# Patient Record
Sex: Female | Born: 1980 | Race: White | Hispanic: No | Marital: Married
Health system: Southern US, Community
[De-identification: ages and names within clinical notes are randomized; demographics above are authoritative.]

## PROBLEM LIST (undated history)

## (undated) DIAGNOSIS — E559 Vitamin D deficiency, unspecified: Secondary | ICD-10-CM

## (undated) DIAGNOSIS — I700 Atherosclerosis of aorta: Secondary | ICD-10-CM

## (undated) DIAGNOSIS — F419 Anxiety disorder, unspecified: Secondary | ICD-10-CM

## (undated) DIAGNOSIS — R079 Chest pain, unspecified: Secondary | ICD-10-CM

## (undated) DIAGNOSIS — K219 Gastro-esophageal reflux disease without esophagitis: Secondary | ICD-10-CM

## (undated) DIAGNOSIS — I10 Essential (primary) hypertension: Secondary | ICD-10-CM

## (undated) HISTORY — DX: Vitamin D deficiency, unspecified: E55.9

## (undated) HISTORY — DX: Gastro-esophageal reflux disease without esophagitis: K21.9

## (undated) HISTORY — DX: Atherosclerosis of aorta: I70.0

## (undated) HISTORY — DX: Anxiety disorder, unspecified: F41.9

## (undated) HISTORY — DX: Chest pain, unspecified: R07.9

## (undated) HISTORY — PX: NO PAST SURGERIES: SHX2092

## (undated) HISTORY — PX: ESOPHAGOGASTRODUODENOSCOPY: SHX1529

---

## 2003-10-29 ENCOUNTER — Emergency Department (HOSPITAL_COMMUNITY): Admission: EM | Admit: 2003-10-29 | Discharge: 2003-10-30 | Payer: Self-pay | Admitting: Emergency Medicine

## 2005-10-13 ENCOUNTER — Observation Stay: Payer: Self-pay

## 2005-10-20 ENCOUNTER — Inpatient Hospital Stay: Payer: Self-pay | Admitting: Obstetrics and Gynecology

## 2005-11-10 ENCOUNTER — Observation Stay: Payer: Self-pay | Admitting: Obstetrics and Gynecology

## 2005-11-12 ENCOUNTER — Inpatient Hospital Stay: Payer: Self-pay

## 2006-08-17 ENCOUNTER — Inpatient Hospital Stay: Payer: Self-pay | Admitting: Internal Medicine

## 2009-11-09 ENCOUNTER — Ambulatory Visit: Payer: Self-pay | Admitting: Obstetrics and Gynecology

## 2009-11-09 ENCOUNTER — Encounter: Payer: Self-pay | Admitting: Obstetrics & Gynecology

## 2009-11-09 LAB — CONVERTED CEMR LAB: Hepatitis B Surface Ag: NEGATIVE

## 2010-06-15 NOTE — Assessment & Plan Note (Signed)
NAME:  Karen Ortega, Karen Ortega NO.:  [id number]   MEDICAL RECORD NO.:  [id number]          PATIENT TYPE:  POB   LOCATION:  [facility] at [facility]         FACILITY:  [facility] [place]   PHYSICIAN:  Catalina Antigua, MD     DATE OF BIRTH:  06/29/1980   DATE OF SERVICE:  11/09/2009                                  CLINIC NOTE   This is 30 year old G1, P1 who presents today for annual exam.  The  patient is currently without any significant complaints.  Denies  abnormal discharge or pelvic pain.  The patient was previously using  Depo-Provera for birth control and stopped in May 2011.  The patient  states that since stopping the Depo, she experienced no bleeding for  approximately a month and then started daily spotting since.  The  patient denies any chest pain, lightheadedness, or dizziness.  No  palpitations.  The patient is currently not using any form of birth  control and is not trying to seek pregnancy.  The patient is also  interested in STD testing today.   PAST MEDICAL HISTORY:  She denies.   PAST SURGICAL HISTORY:  Also denies.   PAST OBSTETRIC HISTORY:  She has had 1 full-term vaginal delivery.   PAST GYNECOLOGIC HISTORY:  She denies any cyst or fibroids or previous  STDs.  She does report a history of abnormal Pap smear status post  colposcopy and questionable LEEP procedure and reports that her Pap  smear have been normal since.   SOCIAL HISTORY:  She is a current smoker of one pack a day for  approximately 14 years.  The patient is not currently interested in  quitting smoking at this time.  She denies EtOH abuse or of the use of  illicit drugs.   FAMILY HISTORY:  Significant for heart disease and hypertension in her  mother.   REVIEW OF SYSTEM:  Significant for anxiety symptoms, but otherwise  within normal limits.   PHYSICAL EXAMINATION:  VITAL SIGNS:  Her blood pressure is 139/93, pulse  of 82, weight 104 pounds, height of 5 feet 8 inches.  LUNGS:  Clear to  auscultation bilaterally.  HEART:  Regular rate and rhythm.  BREASTS:  Nontender, equal in size.  No palpable masses or  lymphadenopathy.  No expressible nipple discharge or skin dimpling.  ABDOMEN:  Soft, nontender, nondistended.  PELVIC:  She had normal vaginal mucosa and appearing cervix.  No  abnormal bleeding or discharge.  She has a small anteverted uterus.  No  palpable masses or tenderness.   ASSESSMENT AND PLAN:  This is 30 year old G1, P1 who is here for annual  exam, Pap smear, and sexually transmitted disease testing including  hepatitis B, hepatitis C, syphilis, and HIV testing were done.  It was  explained to the patient that she may experience irregular bleeding  after stopping Depo-Provera for an unpredictable amount of time, which  does not mean that her fertility is not restored.  Therefore, the  patient was interested in initiating birth control pill and a  prescription for Cyclessa was provided.  The patient was also encouraged  to quit smoking or think about quitting smoking.  The  patient will be  contacted with any abnormal results.  The patient is to return in 1 year  or p.r.n.  The patient was also advised to follow up with a primary care  physician in view of her borderlinely elevated blood pressure.  The  patient states that she is normally normotensive and has had a recent  physical with her primary care physician and everything was normal.  Records from her previous physicians have been requested.           ______________________________  Catalina Antigua, MD     PC/MEDQ  D:  11/09/2009  T:  11/10/2009  Job:  [id number]

## 2013-07-16 ENCOUNTER — Emergency Department (HOSPITAL_COMMUNITY)
Admission: EM | Admit: 2013-07-16 | Discharge: 2013-07-16 | Disposition: A | Discharge status: Home / Self Care | Payer: Self-pay | Attending: Emergency Medicine | Admitting: Emergency Medicine

## 2013-07-16 ENCOUNTER — Encounter (HOSPITAL_COMMUNITY): Payer: Self-pay | Admitting: Emergency Medicine

## 2013-07-16 DIAGNOSIS — T63461A Toxic effect of venom of wasps, accidental (unintentional), initial encounter: Secondary | ICD-10-CM | POA: Insufficient documentation

## 2013-07-16 DIAGNOSIS — Y929 Unspecified place or not applicable: Secondary | ICD-10-CM | POA: Insufficient documentation

## 2013-07-16 DIAGNOSIS — T63481A Toxic effect of venom of other arthropod, accidental (unintentional), initial encounter: Secondary | ICD-10-CM

## 2013-07-16 DIAGNOSIS — Y990 Civilian activity done for income or pay: Secondary | ICD-10-CM | POA: Insufficient documentation

## 2013-07-16 DIAGNOSIS — F17200 Nicotine dependence, unspecified, uncomplicated: Secondary | ICD-10-CM | POA: Insufficient documentation

## 2013-07-16 DIAGNOSIS — Y9389 Activity, other specified: Secondary | ICD-10-CM | POA: Insufficient documentation

## 2013-07-16 DIAGNOSIS — T6391XA Toxic effect of contact with unspecified venomous animal, accidental (unintentional), initial encounter: Secondary | ICD-10-CM | POA: Insufficient documentation

## 2013-07-16 NOTE — ED Provider Notes (Signed)
CSN: [id number]     Arrival date & time 07/16/13  0840 History  This chart was scribed for non-physician practitioner, Ivar Drapeob Browning, working with Joya Gaskinsonald W Wickline, MD by Milly JakobJohn Lee Graves, ED Scribe. The patient was seen in room TR08C/TR08C. Patient's care was started at 9:41 AM.       Chief Complaint  Patient presents with  . Hand Pain    The history is provided by the patient. No language interpreter was used.   HPI Comments: Bennett ScrapeHolli Ortega is a 33 y.o. female who presents to the Emergency Department with chief complaint of insect bite. She states that she put on some gloves while at work today, and was stung by something. She noticed some swelling to the right hand, and also became diaphoretic. 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 She has tried taking one Benadryl with good relief. She denies any radiating symptoms. There are no aggravating factors.  History reviewed. No pertinent past medical history. History reviewed. No pertinent past surgical history. No family history on file. History  Substance Use Topics  . Smoking status: Current Every Day Smoker  . Smokeless tobacco: Not on file  . Alcohol Use: Yes   OB History   Grav Para Term Preterm Abortions TAB SAB Ect Mult Living                 Review of Systems  Constitutional: Negative for fever and chills.  Respiratory: Negative for shortness of breath.   Cardiovascular: Negative for chest pain.  Gastrointestinal: Negative for nausea, vomiting, diarrhea and constipation.  Genitourinary: Negative for dysuria.  Skin: Positive for rash.      Allergies  Review of patient's allergies indicates no known allergies.  Home Medications   Prior to Admission medications   Not on File   Treatment Vitals: BP 137/94  Pulse 91  Temp(Src) 97.6 F (36.4 C) (Oral)  Resp 22  Ht 5\' 6"  (1.676 m)  Wt 100 lb (45.36 kg)  BMI 16.15 kg/m2  SpO2  100% Physical Exam  Nursing note and vitals reviewed. Constitutional: She is oriented to person, place, and time. She appears well-developed and well-nourished. No distress.  HENT:  Head: Normocephalic and atraumatic.  Eyes: Conjunctivae and EOM are normal. Pupils are equal, round, and reactive to light.  Neck: Normal range of motion. Neck supple. No tracheal deviation present.  Cardiovascular: Normal rate and regular rhythm.  Exam reveals no gallop and no friction rub.   No murmur heard. Pulmonary/Chest: Effort normal and breath sounds normal. No respiratory distress. She has no wheezes. She has no rales. She exhibits no tenderness.  Clear to auscultation bilaterally, no wheezes  Abdominal: Soft. Bowel sounds are normal. She exhibits no distension and no mass. There is no tenderness. There is no rebound and no guarding.  Musculoskeletal: Normal range of motion. She exhibits no edema and no tenderness.  Neurological: She is alert and oriented to person, place, and time.  Skin: Skin is warm and dry.  Mild swelling around the third MCP of the right hand  Psychiatric: She has a normal mood and affect. Her behavior is normal. Judgment and thought content normal.    ED Course  Procedures (including critical care time) DIAGNOSTIC STUDIES: Oxygen Saturation is 100% on room air, normal by my interpretation.    Labs Review Labs Reviewed - No data to display  Imaging Review No results found.  EKG Interpretation None      MDM   Final diagnoses:  Insect sting    The patient with insect bite to the right hand, possibly a bee sting, there is moderate swelling about the right third MCP, patient is not in any respiratory distress. She speaks in full sentences. Her lungs are clear. No evidence of anaphylactic reaction. Patient given Benadryl, and states that her symptoms are improving. Will monitor for approximately a half hour, and will reassess. Anticipate discharge to home.  10:33  AM Reexamined, with no respiratory distress, wheezing, or throat swelling. Monitored for approximately 2 hours the emergency department. Improvement in symptoms. Will discharge to home. Recommend continued Benadryl.  I personally performed the services described in this documentation, which was scribed in my presence. The recorded information has been reviewed and is accurate.     Roxy Horsemanobert Browning, PA-C 07/16/13 1034

## 2013-07-16 NOTE — Discharge Instructions (Signed)
Bee, Wasp, or Hornet Sting °Your caregiver has diagnosed you as having an insect sting. An insect sting appears as a red lump in the skin that sometimes has a tiny hole in the center, or it may have a stinger in the center of the wound. The most common stings are from wasps, hornets and bees. °Individuals have different reactions to insect stings. °· A normal reaction may cause pain, swelling, and redness around the sting site. °· A localized allergic reaction may cause swelling and redness that extends beyond the sting site. °· A large local reaction may continue to develop over the next 12 to 36 hours. °· On occasion, the reactions can be severe (anaphylactic reaction). An anaphylactic reaction may cause wheezing; difficulty breathing; chest pain; fainting; raised, itchy, red patches on the skin; a sick feeling to your stomach (nausea); vomiting; cramping; or diarrhea. If you have had an anaphylactic reaction to an insect sting in the past, you are more likely to have one again. °HOME CARE INSTRUCTIONS  °· With bee stings, a small sac of poison is left in the wound. Brushing across this with something such as a credit card, or anything similar, will help remove this and decrease the amount of the reaction. This same procedure will not help a wasp sting as they do not leave behind a stinger and poison sac. °· Apply a cold compress for 10 to 20 minutes every hour for 1 to 2 days, depending on severity, to reduce swelling and itching. °· To lessen pain, a paste made of water and baking soda may be rubbed on the bite or sting and left on for 5 minutes. °· To relieve itching and swelling, you may use take medication or apply medicated creams or lotions as directed. °· Only take over-the-counter or prescription medicines for pain, discomfort, or fever as directed by your caregiver. °· Wash the sting site daily with soap and water. Apply antibiotic ointment on the sting site as directed. °· If you suffered a severe  reaction: °· If you did not require hospitalization, an adult will need to stay with you for 24 hours in case the symptoms return. °· You may need to wear a medical bracelet or necklace stating the allergy. °· You and your family need to learn when and how to use an anaphylaxis kit or epinephrine injection. °· If you have had a severe reaction before, always carry your anaphylaxis kit with you. °SEEK MEDICAL CARE IF:  °· None of the above helps within 2 to 3 days. °· The area becomes red, warm, tender, and swollen beyond the area of the bite or sting. °· You have an oral temperature above 102° F (38.9° C). °SEEK IMMEDIATE MEDICAL CARE IF:  °You have symptoms of an allergic reaction which are: °· Wheezing. °· Difficulty breathing. °· Chest pain. °· Lightheadedness or fainting. °· Itchy, raised, red patches on the skin. °· Nausea, vomiting, cramping or diarrhea. °ANY OF THESE SYMPTOMS MAY REPRESENT A SERIOUS PROBLEM THAT IS AN EMERGENCY. Do not wait to see if the symptoms will go away. Get medical help right away. Call your local emergency services (911 in [place] DO NOT drive yourself to the hospital. °MAKE SURE YOU:  °· Understand these instructions. °· Will watch your condition. °· Will get help right away if you are not doing well or get worse. °Document Released: 01/17/2005 Document Revised: 04/11/2011 Document Reviewed: 07/04/2009 °Exit[employer]® Patient Information ©2014 [employer]. ° °

## 2013-07-16 NOTE — ED Notes (Signed)
Pt presents to department for evaluation of R hand swelling/pain, also states pain to neck. Possible insect bite at work this morning. 5/10 pain at the time. Pt is alert and oriented x4. NAD. Respirations unlabored.

## 2013-07-17 NOTE — ED Provider Notes (Signed)
Medical screening examination/treatment/procedure(s) were performed by non-physician practitioner and as supervising physician I was immediately available for consultation/collaboration.   EKG Interpretation None        Donald W Wickline, MD 07/17/13 2024 

## 2014-11-08 ENCOUNTER — Encounter: Payer: Self-pay | Admitting: Emergency Medicine

## 2014-11-08 ENCOUNTER — Emergency Department
Admission: EM | Admit: 2014-11-08 | Discharge: 2014-11-08 | Disposition: A | Discharge status: Home / Self Care | Payer: Self-pay | Attending: Emergency Medicine | Admitting: Emergency Medicine

## 2014-11-08 ENCOUNTER — Emergency Department: Payer: Self-pay

## 2014-11-08 DIAGNOSIS — Z3491 Encounter for supervision of normal pregnancy, unspecified, first trimester: Secondary | ICD-10-CM

## 2014-11-08 DIAGNOSIS — O99331 Smoking (tobacco) complicating pregnancy, first trimester: Secondary | ICD-10-CM | POA: Insufficient documentation

## 2014-11-08 DIAGNOSIS — Z3A01 Less than 8 weeks gestation of pregnancy: Secondary | ICD-10-CM | POA: Insufficient documentation

## 2014-11-08 DIAGNOSIS — O200 Threatened abortion: Secondary | ICD-10-CM | POA: Insufficient documentation

## 2014-11-08 DIAGNOSIS — F17210 Nicotine dependence, cigarettes, uncomplicated: Secondary | ICD-10-CM | POA: Insufficient documentation

## 2014-11-08 LAB — URINALYSIS COMPLETE WITH MICROSCOPIC (ARMC ONLY)
BILIRUBIN URINE: NEGATIVE
Bacteria, UA: NONE SEEN
Glucose, UA: NEGATIVE mg/dL
Ketones, ur: NEGATIVE mg/dL
Nitrite: NEGATIVE
PH: 6 (ref 5.0–8.0)
Protein, ur: 30 mg/dL — AB
Specific Gravity, Urine: 1.016 (ref 1.005–1.030)

## 2014-11-08 LAB — CBC WITH DIFFERENTIAL/PLATELET
BASOS ABS: 0 10*3/uL (ref 0–0.1)
BASOS PCT: 1 %
EOS ABS: 0 10*3/uL (ref 0–0.7)
Eosinophils Relative: 1 %
HEMATOCRIT: 45.2 % (ref 35.0–47.0)
HEMOGLOBIN: 15.3 g/dL (ref 12.0–16.0)
Lymphocytes Relative: 21 %
Lymphs Abs: 1.5 10*3/uL (ref 1.0–3.6)
MCH: 35.4 pg — ABNORMAL HIGH (ref 26.0–34.0)
MCHC: 33.8 g/dL (ref 32.0–36.0)
MCV: 105 fL — ABNORMAL HIGH (ref 80.0–100.0)
MONOS PCT: 8 %
Monocytes Absolute: 0.6 10*3/uL (ref 0.2–0.9)
NEUTROS ABS: 5 10*3/uL (ref 1.4–6.5)
NEUTROS PCT: 69 %
Platelets: 438 10*3/uL (ref 150–440)
RBC: 4.31 MIL/uL (ref 3.80–5.20)
RDW: 12.4 % (ref 11.5–14.5)
WBC: 7.1 10*3/uL (ref 3.6–11.0)

## 2014-11-08 LAB — BASIC METABOLIC PANEL
ANION GAP: 10 (ref 5–15)
BUN: 6 mg/dL (ref 6–20)
CALCIUM: 9 mg/dL (ref 8.9–10.3)
CO2: 25 mmol/L (ref 22–32)
CREATININE: 0.8 mg/dL (ref 0.44–1.00)
Chloride: 102 mmol/L (ref 101–111)
Glucose, Bld: 213 mg/dL — ABNORMAL HIGH (ref 65–99)
Potassium: 4 mmol/L (ref 3.5–5.1)
SODIUM: 137 mmol/L (ref 135–145)

## 2014-11-08 LAB — CHLAMYDIA/NGC RT PCR (ARMC ONLY)
Chlamydia Tr: NOT DETECTED
N GONORRHOEAE: NOT DETECTED

## 2014-11-08 LAB — WET PREP, GENITAL
Trich, Wet Prep: NONE SEEN
YEAST WET PREP: NONE SEEN

## 2014-11-08 LAB — POCT PREGNANCY, URINE: Preg Test, Ur: POSITIVE — AB

## 2014-11-08 LAB — HCG, QUANTITATIVE, PREGNANCY: hCG, Beta Chain, Quant, S: 1081 m[IU]/mL — ABNORMAL HIGH (ref <5)

## 2014-11-08 LAB — ABO/RH: ABO/RH(D): O POS

## 2014-11-08 NOTE — ED Provider Notes (Addendum)
[facility] Emergency Department Provider Note   ____________________________________________  Time seen: 11:30 AM I have reviewed the triage vital signs and the triage nursing note.  HISTORY  Chief Complaint Vaginal Bleeding and Threatened Miscarriage   Historian Patient  HPI Karen Ortega is a 34 y.o. female who is G2 P1 who took a pregnancy test at home yesterday and it was positive. Patient has had about 1 week of mild vaginal spotting. Mild left lower quadrant discomfort. No dysuria. No hematuria. Last pregnancy was 9 years ago. States that her periods are irregular, so she does not know when her last menstrual period was.    History reviewed. No pertinent past medical history.  There are no active problems to display for this patient.   History reviewed. No pertinent past surgical history.  No current outpatient prescriptions on file. none  Allergies Bee venom and Other  History reviewed. No pertinent family history.  Social History Social History  Substance Use Topics  . Smoking status: Current Every Day Smoker -- 0.50 packs/day    Types: Cigarettes  . Smokeless tobacco: None  . Alcohol Use: Yes    Review of Systems  Constitutional: Negative for fever. Eyes: Negative for visual changes. ENT: Recent nasal congestion/URI Cardiovascular: Negative for chest pain. Respiratory: Negative for shortness of breath. Gastrointestinal: Negative for  vomiting and diarrhea. Genitourinary: Negative for dysuria. Musculoskeletal: Negative for back pain. Skin: Negative for rash. Neurological: Negative for headache. 10 point Review of Systems otherwise negative ____________________________________________   PHYSICAL EXAM:  VITAL SIGNS: ED Triage Vitals  Enc Vitals Group     BP 11/08/14 0828 137/109 mmHg     Pulse Rate 11/08/14 0828 123     Resp 11/08/14 0828 20     Temp 11/08/14 0828 98.3 F (36.8 C)     Temp Source 11/08/14 0828 Oral      SpO2 11/08/14 0828 99 %     Weight 11/08/14 0828 108 lb (48.988 kg)     Height 11/08/14 0828  (1.702 m)     Head Cir --      Peak Flow --      Pain Score 11/08/14 0828 2     Pain Loc --      Pain Edu? --      Excl. in GC? --      Constitutional: Alert and oriented. Well appearing and in no distress. Eyes: Conjunctivae are normal. PERRL. Normal extraocular movements. ENT   Head: Normocephalic and atraumatic.   Nose: No congestion/rhinnorhea.   Mouth/Throat: Mucous membranes are moist.   Neck: No stridor. Cardiovascular/Chest: Normal rate, regular rhythm.  No murmurs, rubs, or gallops. Respiratory: Normal respiratory effort without tachypnea nor retractions. Breath sounds are clear and equal bilaterally. No wheezes/rales/rhonchi. Gastrointestinal: Soft. No distention, no guarding, no rebound. Mild tenderness in the left lower quadrant.  Genitourinary/rectal: Small brown brown discharge on pelvic exam. No vaginal clots. No evidence of cervicitis. Mild left lower quadrant/adnexal tenderness without mass. Musculoskeletal: Nontender with normal range of motion in all extremities. No joint effusions.  No lower extremity tenderness.  No edema. Neurologic:  Normal speech and language. No gross or focal neurologic deficits are appreciated. Skin:  Skin is warm, dry and intact. No rash noted. Psychiatric: Mood and affect are normal. Speech and behavior are normal. Patient exhibits appropriate insight and judgment.  ____________________________________________   EKG I, Governor Rooks, MD, the attending physician have personally viewed and interpreted all ECGs.  No EKG performed ____________________________________________  LABS (pertinent positives/negatives)  Urine pregnancy test negative Beta hCG 1081 Wet prep clue cells and moderate white blood cells Gonorrhea and Chlamydia negative Blood type O+ Urinalysis 1+ leukocytes, 6-30 red blood cells, 6-30 white blood  cells, no bacteria, 6-30 squamous epithelial cells Basic metabolic panel without significant abnormality White blood count 7.1 with no left shift. Hemoglobin 15.3 and platelet count 438  ____________________________________________  RADIOLOGY All Xrays were viewed by me. Imaging interpreted by Radiologist.  Ultrasound transvaginal pelvic:   FINDINGS: Intrauterine gestational sac: Visualized/normal in shape.  Yolk sac: Not visualized.  Embryo: Not visualized.  Cardiac Activity: Not visualized.  MSD: 3.6 mm  5 w  1 d  [place] [facility]: July 10, 2015.  Maternal uterus/adnexae: No free fluid is noted. Right ovary appears normal. Probable corpus luteum cyst seen in left ovary.  IMPRESSION: Probable early intrauterine gestational sac, but no yolk sac, fetal pole, or cardiac activity yet visualized. Recommend follow-up quantitative B-HCG levels and follow-up US in 14 days to confirm and assess viability. This recommendation follows SRU consensus guidelines: Diagnostic Criteria for Nonviable Pregnancy Early in the First Trimester. Malva Limes Med 2013; 369:1443-51. __________________________________________  PROCEDURES  Procedure(s) performed: None  Critical Care performed: None  ____________________________________________   ED COURSE / ASSESSMENT AND PLAN  CONSULTATIONS: Phone consultation with OB/GYN Dr. Chauncey Cruel  Pertinent labs & imaging results that were available during my care of the patient were reviewed by me and considered in my medical decision making (see chart for details).  Small amount of vaginal spotting and positive pregnancy test at home, concerning for either early pregnancy, ectopic pregnancy, or threatened miscarriage.  Patient is asymptomatic from a bacterial vaginosis standpoint, despite white blood cells and clue cells on vaginal swab. I will hold off on treatment the first trimester as patient is asymptomatic.  Gestational sac consistent with 5 weeks.  Patient will need repeat beta hCG in 48 hours. I discussed this with Dr. Chauncey Cruel, and patient will call on Monday for a same-day appointment for this blood draw.  Patient is having no dysuria, or suprapubic discomfort, and her urinalysis looks like it is likely contaminated. I discussed with her that I will send a culture. Patient will be called if the culture does indeed grow a urinary tract infection.  Patient / Family / Caregiver informed of clinical course, medical decision-making process, and agree with plan.   I discussed return precautions, follow-up instructions, and discharged instructions with patient and/or family.  ___________________________________________   FINAL CLINICAL IMPRESSION(S) / ED DIAGNOSES   Final diagnoses:  First trimester pregnancy  Threatened miscarriage in early pregnancy       Governor Rooks, MD 11/08/14 1330  Governor Rooks, MD 11/08/14 1331

## 2014-11-08 NOTE — ED Notes (Signed)
Pt states approx 1 week ago she began spotting. Pt states she took an at home pregnancy test yesterday and it was positive. Pt states last night pt had increased spotting of bright red blood, denies clots at this time. Pt also c/o cramping in her lower abdomen.

## 2014-11-08 NOTE — Discharge Instructions (Signed)
You have had a positive pregnancy test, and you will need a repeat beta hCG pregnancy hormone test on Monday. Call the OB/GYN office on Monday morning and tell them you need a blood draw and appointment for the same day, on Monday.  Return to the emergency department for any worsening pain, vaginal bleeding heavier than 1 pad per hour for 3 hours, dizziness or passing out, or any other symptoms concerning to you.   First Trimester of Pregnancy The first trimester of pregnancy is from week 1 until the end of week 12 (months 1 through 3). A week after a sperm fertilizes an egg, the egg will implant on the wall of the uterus. This embryo will begin to develop into a baby. Genes from you and your partner are forming the baby. The female genes determine whether the baby is a boy or a girl. At 6-8 weeks, the eyes and face are formed, and the heartbeat can be seen on ultrasound. At the end of 12 weeks, all the baby's organs are formed.  Now that you are pregnant, you will want to do everything you can to have a healthy baby. Two of the most important things are to get good prenatal care and to follow your health care provider's instructions. Prenatal care is all the medical care you receive before the baby's birth. This care will help prevent, find, and treat any problems during the pregnancy and childbirth. BODY CHANGES Your body goes through many changes during pregnancy. The changes vary from woman to woman.   You may gain or lose a couple of pounds at first.  You may feel sick to your stomach (nauseous) and throw up (vomit). If the vomiting is uncontrollable, call your health care provider.  You may tire easily.  You may develop headaches that can be relieved by medicines approved by your health care provider.  You may urinate more often. Painful urination may mean you have a bladder infection.  You may develop heartburn as a result of your pregnancy.  You may develop constipation because certain  hormones are causing the muscles that push waste through your intestines to slow down.  You may develop hemorrhoids or swollen, bulging veins (varicose veins).  Your breasts may begin to grow larger and become tender. Your nipples may stick out more, and the tissue that surrounds them (areola) may become darker.  Your gums may bleed and may be sensitive to brushing and flossing.  Dark spots or blotches (chloasma, mask of pregnancy) may develop on your face. This will likely fade after the baby is born.  Your menstrual periods will stop.  You may have a loss of appetite.  You may develop cravings for certain kinds of food.  You may have changes in your emotions from day to day, such as being excited to be pregnant or being concerned that something may go wrong with the pregnancy and baby.  You may have more vivid and strange dreams.  You may have changes in your hair. These can include thickening of your hair, rapid growth, and changes in texture. Some women also have hair loss during or after pregnancy, or hair that feels dry or thin. Your hair will most likely return to normal after your baby is born. WHAT TO EXPECT AT YOUR PRENATAL VISITS During a routine prenatal visit:  You will be weighed to make sure you and the baby are growing normally.  Your blood pressure will be taken.  Your abdomen will be measured to  track your baby's growth.  The fetal heartbeat will be listened to starting around week 10 or 12 of your pregnancy.  Test results from any previous visits will be discussed. Your health care provider may ask you:  How you are feeling.  If you are feeling the baby move.  If you have had any abnormal symptoms, such as leaking fluid, bleeding, severe headaches, or abdominal cramping.  If you are using any tobacco products, including cigarettes, chewing tobacco, and electronic cigarettes.  If you have any questions. Other tests that may be performed during your first  trimester include:  Blood tests to find your blood type and to check for the presence of any previous infections. They will also be used to check for low iron levels (anemia) and Rh antibodies. Later in the pregnancy, blood tests for diabetes will be done along with other tests if problems develop.  Urine tests to check for infections, diabetes, or protein in the urine.  An ultrasound to confirm the proper growth and development of the baby.  An amniocentesis to check for possible genetic problems.  Fetal screens for spina bifida and Down syndrome.  You may need other tests to make sure you and the baby are doing well.  HIV (human immunodeficiency virus) testing. Routine prenatal testing includes screening for HIV, unless you choose not to have this test. HOME CARE INSTRUCTIONS  Medicines  Follow your health care provider's instructions regarding medicine use. Specific medicines may be either safe or unsafe to take during pregnancy.  Take your prenatal vitamins as directed.  If you develop constipation, try taking a stool softener if your health care provider approves. Diet  Eat regular, well-balanced meals. Choose a variety of foods, such as meat or vegetable-based protein, fish, milk and low-fat dairy products, vegetables, fruits, and whole grain breads and cereals. Your health care provider will help you determine the amount of weight gain that is right for you.  Avoid raw meat and uncooked cheese. These carry germs that can cause birth defects in the baby.  Eating four or five small meals rather than three large meals a day may help relieve nausea and vomiting. If you start to feel nauseous, eating a few soda crackers can be helpful. Drinking liquids between meals instead of during meals also seems to help nausea and vomiting.  If you develop constipation, eat more high-fiber foods, such as fresh vegetables or fruit and whole grains. Drink enough fluids to keep your urine clear or  pale yellow. Activity and Exercise  Exercise only as directed by your health care provider. Exercising will help you:  Control your weight.  Stay in shape.  Be prepared for labor and delivery.  Experiencing pain or cramping in the lower abdomen or low back is a good sign that you should stop exercising. Check with your health care provider before continuing normal exercises.  Try to avoid standing for long periods of time. Move your legs often if you must stand in one place for a long time.  Avoid heavy lifting.  Wear low-heeled shoes, and practice good posture.  You may continue to have sex unless your health care provider directs you otherwise. Relief of Pain or Discomfort  Wear a good support bra for breast tenderness.   Take warm sitz baths to soothe any pain or discomfort caused by hemorrhoids. Use hemorrhoid cream if your health care provider approves.   Rest with your legs elevated if you have leg cramps or low back pain.  If you develop varicose veins in your legs, wear support hose. Elevate your feet for 15 minutes, 3-4 times a day. Limit salt in your diet. Prenatal Care  Schedule your prenatal visits by the twelfth week of pregnancy. They are usually scheduled monthly at first, then more often in the last 2 months before delivery.  Write down your questions. Take them to your prenatal visits.  Keep all your prenatal visits as directed by your health care provider. Safety  Wear your seat belt at all times when driving.  Make a list of emergency phone numbers, including numbers for family, friends, the hospital, and police and fire departments. General Tips  Ask your health care provider for a referral to a local prenatal education class. Begin classes no later than at the beginning of month 6 of your pregnancy.  Ask for help if you have counseling or nutritional needs during pregnancy. Your health care provider can offer advice or refer you to specialists for  help with various needs.  Do not use hot tubs, steam rooms, or saunas.  Do not douche or use tampons or scented sanitary pads.  Do not cross your legs for long periods of time.  Avoid cat litter boxes and soil used by cats. These carry germs that can cause birth defects in the baby and possibly loss of the fetus by miscarriage or stillbirth.  Avoid all smoking, herbs, alcohol, and medicines not prescribed by your health care provider. Chemicals in these affect the formation and growth of the baby.  Do not use any tobacco products, including cigarettes, chewing tobacco, and electronic cigarettes. If you need help quitting, ask your health care provider. You may receive counseling support and other resources to help you quit.  Schedule a dentist appointment. At home, brush your teeth with a soft toothbrush and be gentle when you floss. SEEK MEDICAL CARE IF:   You have dizziness.  You have mild pelvic cramps, pelvic pressure, or nagging pain in the abdominal area.  You have persistent nausea, vomiting, or diarrhea.  You have a bad smelling vaginal discharge.  You have pain with urination.  You notice increased swelling in your face, hands, legs, or ankles. SEEK IMMEDIATE MEDICAL CARE IF:   You have a fever.  You are leaking fluid from your vagina.  You have spotting or bleeding from your vagina.  You have severe abdominal cramping or pain.  You have rapid weight gain or loss.  You vomit blood or material that looks like coffee grounds.  You are exposed to [place] measles and have never had them.  You are exposed to fifth disease or chickenpox.  You develop a severe headache.  You have shortness of breath.  You have any kind of trauma, such as from a fall or a car accident.   This information is not intended to replace advice given to you by your health care provider. Make sure you discuss any questions you have with your health care provider.   Document Released:  01/11/2001 Document Revised: 02/07/2014 Document Reviewed: 11/27/2012 Elsevier Interactive Patient Education 2016 [employer].  Threatened Miscarriage A threatened miscarriage occurs when you have vaginal bleeding during your first 20 weeks of pregnancy but the pregnancy has not ended. If you have vaginal bleeding during this time, your health care provider will do tests to make sure you are still pregnant. If the tests show you are still pregnant and the developing baby (fetus) inside your womb (uterus) is still growing, your condition is  considered a threatened miscarriage. A threatened miscarriage does not mean your pregnancy will end, but it does increase the risk of losing your pregnancy (complete miscarriage). CAUSES  The cause of a threatened miscarriage is usually not known. If you go on to have a complete miscarriage, the most common cause is an abnormal number of chromosomes in the developing baby. Chromosomes are the structures inside cells that hold all your genetic material. Some causes of vaginal bleeding that do not result in miscarriage include:  Having sex.  Having an infection.  Normal hormone changes of pregnancy.  Bleeding that occurs when an egg implants in your uterus. RISK FACTORS Risk factors for bleeding in early pregnancy include:  Obesity.  Smoking.  Drinking excessive amounts of alcohol or caffeine.  Recreational drug use. SIGNS AND SYMPTOMS  Light vaginal bleeding.  Mild abdominal pain or cramps. DIAGNOSIS  If you have bleeding with or without abdominal pain before 20 weeks of pregnancy, your health care provider will do tests to check whether you are still pregnant. One important test involves using sound waves and a computer (ultrasound) to create images of the inside of your uterus. Other tests include an internal exam of your vagina and uterus (pelvic exam) and measurement of your baby's heart rate.  You may be diagnosed with a threatened  miscarriage if:  Ultrasound testing shows you are still pregnant.  Your baby's heart rate is strong.  A pelvic exam shows that the opening between your uterus and your vagina (cervix) is closed.  Your heart rate and blood pressure are stable.  Blood tests confirm you are still pregnant. TREATMENT  No treatments have been shown to prevent a threatened miscarriage from going on to a complete miscarriage. However, the right home care is important.  HOME CARE INSTRUCTIONS   Make sure you keep all your appointments for prenatal care. This is very important.  Get plenty of rest.  Do not have sex or use tampons if you have vaginal bleeding.  Do not douche.  Do not smoke or use recreational drugs.  Do not drink alcohol.  Avoid caffeine. SEEK MEDICAL CARE IF:  You have light vaginal bleeding or spotting while pregnant.  You have abdominal pain or cramping.  You have a fever. SEEK IMMEDIATE MEDICAL CARE IF:  You have heavy vaginal bleeding.  You have blood clots coming from your vagina.  You have severe low back pain or abdominal cramps.  You have fever, chills, and severe abdominal pain. MAKE SURE YOU:  Understand these instructions.  Will watch your condition.  Will get help right away if you are not doing well or get worse.   This information is not intended to replace advice given to you by your health care provider. Make sure you discuss any questions you have with your health care provider.   Document Released: 01/17/2005 Document Revised: 01/22/2013 Document Reviewed: 11/13/2012 Elsevier Interactive Patient Education [employer]

## 2014-11-08 NOTE — ED Notes (Signed)
Pt taken to [place]. NAD noted at this time.

## 2014-11-08 NOTE — ED Notes (Signed)
Pt's friend noted to be in room at this time. Pt has returned from [place]. NAD noted at this time.

## 2014-11-08 NOTE — ED Notes (Signed)
NAD noted at time of D/C. Pt denies questions or concerns. Pt ambulatory to the lobby at this time.  

## 2014-11-10 ENCOUNTER — Emergency Department
Admission: EM | Admit: 2014-11-10 | Discharge: 2014-11-10 | Disposition: A | Discharge status: Home / Self Care | Payer: Self-pay | Attending: Emergency Medicine | Admitting: Emergency Medicine

## 2014-11-10 ENCOUNTER — Encounter: Payer: Self-pay | Admitting: Emergency Medicine

## 2014-11-10 DIAGNOSIS — O99341 Other mental disorders complicating pregnancy, first trimester: Secondary | ICD-10-CM | POA: Insufficient documentation

## 2014-11-10 DIAGNOSIS — O039 Complete or unspecified spontaneous abortion without complication: Secondary | ICD-10-CM | POA: Insufficient documentation

## 2014-11-10 DIAGNOSIS — F17210 Nicotine dependence, cigarettes, uncomplicated: Secondary | ICD-10-CM | POA: Insufficient documentation

## 2014-11-10 DIAGNOSIS — F419 Anxiety disorder, unspecified: Secondary | ICD-10-CM | POA: Insufficient documentation

## 2014-11-10 DIAGNOSIS — Z3A01 Less than 8 weeks gestation of pregnancy: Secondary | ICD-10-CM | POA: Insufficient documentation

## 2014-11-10 DIAGNOSIS — O99331 Smoking (tobacco) complicating pregnancy, first trimester: Secondary | ICD-10-CM | POA: Insufficient documentation

## 2014-11-10 LAB — URINE CULTURE

## 2014-11-10 LAB — HCG, QUANTITATIVE, PREGNANCY: hCG, Beta Chain, Quant, S: 382 m[IU]/mL — ABNORMAL HIGH (ref <5)

## 2014-11-10 NOTE — ED Notes (Signed)
Pt to ed with c/o vaginal bleeding intermittently and increasing in heaviness since Sat.  Pt states only mild abd pain.  Was seen here on Sat for same.  Explained she may be having a miscarriage.  Pt states she was to have beta HCG levels checked today at [facility] office but due to passing a clot this am she came back to ED.

## 2014-11-10 NOTE — ED Provider Notes (Signed)
[facility] Emergency Department Provider Note  ____________________________________________  Time seen: On arrival  I have reviewed the triage vital signs and the nursing notes.   HISTORY  Chief Complaint Vaginal Bleeding    HPI Karen Ortega is a 34 y.o. female who presents with passing a clot vaginally. Patient was seen here 2 days ago and had follow-up with gynecology today for a second hCG check but came to the emergency department because she passed a single clot. She is no longer having spotting. She has mild lower abdominal cramping. She had an ultrasound 2 days ago which showed a intrauterine gestational sac. She denies dysuria. No fevers no chills.     History reviewed. No pertinent past medical history.  There are no active problems to display for this patient.   History reviewed. No pertinent past surgical history.  No current outpatient prescriptions on file.  Allergies Bee venom and Other  History reviewed. No pertinent family history.  Social History Social History  Substance Use Topics  . Smoking status: Current Every Day Smoker -- 0.50 packs/day    Types: Cigarettes  . Smokeless tobacco: None  . Alcohol Use: No    Review of Systems  Constitutional: Negative for fever. Eyes: Negative for visual changes. ENT: Negative for sore throat Cardiovascular: Negative for chest pain. Respiratory: Negative for shortness of breath. Gastrointestinal: Negative for abdominal pain, vomiting and diarrhea. Genitourinary: Negative for dysuria. Positive for passage of clot Musculoskeletal: Negative for back pain. Skin: Negative for rash. Neurological: Negative for headaches or focal weakness Psychiatric: Positive for anxiety    ____________________________________________   PHYSICAL EXAM:  VITAL SIGNS: ED Triage Vitals  Enc Vitals Group     BP 11/10/14 0811 155/96 mmHg     Pulse Rate 11/10/14 0811 99     Resp 11/10/14 0811 20     Temp 11/10/14 0811 97.7 F (36.5 C)     Temp Source 11/10/14 0811 Oral     SpO2 11/10/14 0811 100 %     Weight 11/10/14 0811 108 lb (48.988 kg)     Height 11/10/14 0811  (1.727 m)     Head Cir --      Peak Flow --      Pain Score 11/10/14 0812 3     Pain Loc --      Pain Edu? --      Excl. in GC? --      Constitutional: Alert and oriented. Well appearing and in no distress. Eyes: Conjunctivae are normal.  ENT   Head: Normocephalic and atraumatic.   Mouth/Throat: Mucous membranes are moist. Cardiovascular: Normal rate, regular rhythm. Normal and symmetric distal pulses are present in all extremities. No murmurs, rubs, or gallops. Respiratory: Normal respiratory effort without tachypnea nor retractions. Breath sounds are clear and equal bilaterally.  Gastrointestinal: Soft and non-tender in all quadrants. No distention. There is no CVA tenderness. Genitourinary: deferred Musculoskeletal: Nontender with normal range of motion in all extremities.  Neurologic:  Normal speech and language. No gross focal neurologic deficits are appreciated. Skin:  Skin is warm, dry and intact. No rash noted. Psychiatric: Mood and affect are normal. Patient exhibits appropriate insight and judgment.  ____________________________________________    LABS (pertinent positives/negatives)  Labs Reviewed  HCG, QUANTITATIVE, PREGNANCY    ____________________________________________   EKG  None  ____________________________________________    RADIOLOGY I have personally reviewed any xrays that were ordered on this patient: None today, ultrasound reviewed from 2 days ago  ____________________________________________  PROCEDURES  Procedure(s) performed: none  Critical Care performed: none  ____________________________________________   INITIAL IMPRESSION / ASSESSMENT AND PLAN / ED COURSE  Pertinent labs & imaging results that were available during my care of the patient  were reviewed by me and considered in my medical decision making (see chart for details).  Given that patient is here in the emergency department we will recheck a beta hCG  ----------------------------------------- 9:33 AM on 11/10/2014 -----------------------------------------  Beta hCG has decreased to 381. This is consistent with a miscarriage. I informed the patient that she will likely experience further cramping and bleeding.  ____________________________________________   FINAL CLINICAL IMPRESSION(S) / ED DIAGNOSES  Final diagnoses:  Miscarriage     Jene Every, MD 11/10/14 1424

## 2014-11-10 NOTE — Discharge Instructions (Signed)
Miscarriage  A miscarriage is the sudden loss of an unborn baby (fetus) before the 20th week of pregnancy. Most miscarriages happen in the first 3 months of pregnancy. Sometimes, it happens before a woman even knows she is pregnant. A miscarriage is also called a "spontaneous miscarriage" or "early pregnancy loss." Having a miscarriage can be an emotional experience. Talk with your caregiver about any questions you may have about miscarrying, the grieving process, and your future pregnancy plans.  CAUSES    Problems with the fetal chromosomes that make it impossible for the baby to develop normally. Problems with the baby's genes or chromosomes are most often the result of errors that occur, by chance, as the embryo divides and grows. The problems are not inherited from the parents.   Infection of the cervix or uterus.    Hormone problems.    Problems with the cervix, such as having an incompetent cervix. This is when the tissue in the cervix is not strong enough to hold the pregnancy.    Problems with the uterus, such as an abnormally shaped uterus, uterine fibroids, or congenital abnormalities.    Certain medical conditions.    Smoking, drinking alcohol, or taking illegal drugs.    Trauma.   Often, the cause of a miscarriage is unknown.   SYMPTOMS    Vaginal bleeding or spotting, with or without cramps or pain.   Pain or cramping in the abdomen or lower back.   Passing fluid, tissue, or blood clots from the vagina.  DIAGNOSIS   Your caregiver will perform a physical exam. You may also have an ultrasound to confirm the miscarriage. Blood or urine tests may also be ordered.  TREATMENT    Sometimes, treatment is not necessary if you naturally pass all the fetal tissue that was in the uterus. If some of the fetus or placenta remains in the body (incomplete miscarriage), tissue left behind may become infected and must be removed. Usually, a dilation and curettage (D and C) procedure is performed.  During a D and C procedure, the cervix is widened (dilated) and any remaining fetal or placental tissue is gently removed from the uterus.   Antibiotic medicines are prescribed if there is an infection. Other medicines may be given to reduce the size of the uterus (contract) if there is a lot of bleeding.   If you have Rh negative blood and your baby was Rh positive, you will need a Rh immunoglobulin shot. This shot will protect any future baby from having Rh blood problems in future pregnancies.  HOME CARE INSTRUCTIONS    Your caregiver may order bed rest or may allow you to continue light activity. Resume activity as directed by your caregiver.   Have someone help with home and family responsibilities during this time.    Keep track of the number of sanitary pads you use each day and how soaked (saturated) they are. Write down this information.    Do not use tampons. Do not douche or have sexual intercourse until approved by your caregiver.    Only take over-the-counter or prescription medicines for pain or discomfort as directed by your caregiver.    Do not take aspirin. Aspirin can cause bleeding.    Keep all follow-up appointments with your caregiver.    If you or your partner have problems with grieving, talk to your caregiver or seek counseling to help cope with the pregnancy loss. Allow enough time to grieve before trying to get pregnant again.     SEEK IMMEDIATE MEDICAL CARE IF:    You have severe cramps or pain in your back or abdomen.   You have a fever.   You pass large blood clots (walnut-sized or larger) ortissue from your vagina. Save any tissue for your caregiver to inspect.    Your bleeding increases.    You have a thick, bad-smelling vaginal discharge.   You become lightheaded, weak, or you faint.    You have chills.   MAKE SURE YOU:   Understand these instructions.   Will watch your condition.   Will get help right away if you are not doing well or get worse.     This  information is not intended to replace advice given to you by your health care provider. Make sure you discuss any questions you have with your health care provider.     Document Released: 07/13/2000 Document Revised: 05/14/2012 Document Reviewed: 03/08/2011  Elsevier Interactive Patient Education 2016 [employer]

## 2017-05-15 ENCOUNTER — Encounter: Payer: Self-pay | Admitting: Emergency Medicine

## 2017-05-15 ENCOUNTER — Other Ambulatory Visit: Payer: Self-pay

## 2017-05-15 ENCOUNTER — Emergency Department
Admission: EM | Admit: 2017-05-15 | Discharge: 2017-05-15 | Disposition: A | Discharge status: Home / Self Care | Payer: Self-pay | Attending: Emergency Medicine | Admitting: Emergency Medicine

## 2017-05-15 DIAGNOSIS — F17210 Nicotine dependence, cigarettes, uncomplicated: Secondary | ICD-10-CM | POA: Insufficient documentation

## 2017-05-15 DIAGNOSIS — L0231 Cutaneous abscess of buttock: Secondary | ICD-10-CM | POA: Insufficient documentation

## 2017-05-15 DIAGNOSIS — I10 Essential (primary) hypertension: Secondary | ICD-10-CM | POA: Insufficient documentation

## 2017-05-15 HISTORY — DX: Essential (primary) hypertension: I10

## 2017-05-15 MED ORDER — LIDOCAINE HCL (PF) 1 % IJ SOLN
5.0000 mL | Freq: Once | INTRAMUSCULAR | Status: AC
  Administered 2017-05-15: 5 mL
  Filled 2017-05-15: qty 5

## 2017-05-15 MED ORDER — SULFAMETHOXAZOLE-TRIMETHOPRIM 800-160 MG PO TABS: 1.0000 | ORAL_TABLET | Freq: Two times a day (BID) | ORAL | 0 refills | Status: DC

## 2017-05-15 MED ORDER — ACETAMINOPHEN-CODEINE #3 300-30 MG PO TABS: 1.0000 | ORAL_TABLET | Freq: Four times a day (QID) | ORAL | 0 refills | Status: DC | PRN

## 2017-05-15 NOTE — ED Provider Notes (Signed)
[facility] Emergency Department Provider Note  ____________________________________________   First MD Initiated Contact with Patient 05/15/17 [phone]     (approximate)  I have reviewed the triage vital signs and the nursing notes.   HISTORY  Chief Complaint Abscess  HPI Karen Ortega is a 37 y.o. female is here with complaint of abscess to her buttocks.  She states that she has been doing sitz bath's, heating pad, and applying alcohol swabs to the area without any relief.  She denies any fever or chills.  Patient rates her pain as a 10/10.  She was able to drive herself to the ED and has no one with her.  Past Medical History:  Diagnosis Date  . Hypertension     There are no active problems to display for this patient.   History reviewed. No pertinent surgical history.  Prior to Admission medications   Medication Sig Start Date End Date Taking? Authorizing Provider  acetaminophen-codeine (TYLENOL #3) 300-30 MG tablet Take 1 tablet by mouth every 6 (six) hours as needed for moderate pain. 05/15/17   Tommi RumpsSummers, Rhonda L, PA-C  sulfamethoxazole-trimethoprim (BACTRIM DS,SEPTRA DS) 800-160 MG tablet Take 1 tablet by mouth 2 (two) times daily. 05/15/17   Tommi RumpsSummers, Rhonda L, PA-C    Allergies Bee venom and Other  History reviewed. No pertinent family history.  Social History Social History   Tobacco Use  . Smoking status: Current Every Day Smoker    Packs/day: 0.50    Types: Cigarettes  . Smokeless tobacco: Never Used  Substance Use Topics  . Alcohol use: No  . Drug use: No    Review of Systems Constitutional: No fever/chills Cardiovascular: Denies chest pain. Respiratory: Denies shortness of breath. Musculoskeletal: Negative for back pain. Skin: Positive for abscess. Neurological: Negative for headaches. ___________________________________________   PHYSICAL EXAM:  VITAL SIGNS: ED Triage Vitals  Enc Vitals Group     BP 05/15/17 0847  (!) 128/99     Pulse Rate 05/15/17 0847 (!) 113     Resp 05/15/17 0847 20     Temp 05/15/17 0847 98.7 F (37.1 C)     Temp Source 05/15/17 0847 Oral     SpO2 05/15/17 0847 98 %     Weight 05/15/17 0842 107 lb (48.5 kg)     Height 05/15/17 0842 5\' 7"  (1.702 m)     Head Circumference --      Peak Flow --      Pain Score 05/15/17 0842 10     Pain Loc --      Pain Edu? --      Excl. in GC? --    Constitutional: Alert and oriented. Well appearing and in no acute distress. Eyes: Conjunctivae are normal.  Head: Atraumatic. Neck: No stridor.   Cardiovascular: Normal rate, regular rhythm. Grossly normal heart sounds.  Good peripheral circulation. Respiratory: Normal respiratory effort.  No retractions. Lungs CTAB. Musculoskeletal: Moves upper and lower extremities without any difficulty.  Normal gait was noted. Neurologic:  Normal speech and language. No gross focal neurologic deficits are appreciated.  Skin:  Skin is warm, dry.  Right medial buttocks is erythematous and extremely tender to touch.  There is an area of fluctuance without any drainage noted. Psychiatric: Mood and affect are normal. Speech and behavior are normal.  ____________________________________________   LABS (all labs ordered are listed, but only abnormal results are displayed)  Labs Reviewed - No data to display   PROCEDURES  Procedure(s) performed: INCISION AND  DRAINAGE Performed by: Tommi Rumps Consent: Verbal consent obtained. Risks and benefits: risks, benefits and alternatives were discussed Type: abscess  Body area: Right buttocks  Anesthesia: local infiltration  Incision was made with a scalpel.  Local anesthetic: lidocaine 1 % without epinephrine  Anesthetic total: 4.0 ml  Complexity: complex Blunt dissection to break up loculations  Drainage: purulent  Drainage amount: Moderate  Packing material: 1/4 in iodoform gauze  Patient tolerance: Patient tolerated the procedure well  with no immediate complications.    Procedures  Critical Care performed: No  ____________________________________________   INITIAL IMPRESSION / ASSESSMENT AND PLAN / ED COURSE  As part of my medical decision making, I reviewed the following data within the electronic MEDICAL RECORD NUMBER Notes from prior ED visits and [place] Controlled Substance Database  Patient presents to the emergency department with an abscess to his right buttocks.  Patient was given a prescription for Bactrim DS twice daily for 10 days and Tylenol No. 3 1 every 6 hours as needed for pain.  Patient stated initially that she did not want any pain medication however after the procedure she was told that a prescription would be written and that she did not get it filled she would at least have a prescription on file if she needed it.  Patient is return to the emergency department in 2 days or follow-up with urgent care to have the drain removed.  Currently her chart reads that patient is pregnant however patient is not pregnant and verified.  ____________________________________________   FINAL CLINICAL IMPRESSION(S) / ED DIAGNOSES  Final diagnoses:  Abscess of buttock, right     ED Discharge Orders        Ordered    sulfamethoxazole-trimethoprim (BACTRIM DS,SEPTRA DS) 800-160 MG tablet  2 times daily     05/15/17 1014    acetaminophen-codeine (TYLENOL #3) 300-30 MG tablet  Every 6 hours PRN     05/15/17 1014       Note:  This document was prepared using Dragon voice recognition software and may include unintentional dictation errors.    Tommi Rumps, PA-C 05/15/17 1140    Jeanmarie Plant, MD 05/15/17 1447

## 2017-05-15 NOTE — Discharge Instructions (Addendum)
Follow-up with urgent care or return to the emergency department 2 days for drain removal.  Begin taking antibiotics as directed 1 twice a day for the next 10 days.  Tylenol No. 3, 1 tablet every 6 hours as needed for moderate to severe pain.  Also take stool softeners to prevent constipation while taking pain medication.  Do not take this medication and drive or operate machinery as this may cause drowsiness.  After your drain is removed you may return to warm water sitz baths or warm compresses.

## 2017-05-15 NOTE — ED Triage Notes (Signed)
Presents with a possible abscess area  States swelling started about 1 week ago to lower back area and now swollen area is now larger

## 2017-05-17 ENCOUNTER — Emergency Department
Admission: EM | Admit: 2017-05-17 | Discharge: 2017-05-17 | Disposition: A | Discharge status: Home / Self Care | Payer: Self-pay | Attending: Emergency Medicine | Admitting: Emergency Medicine

## 2017-05-17 ENCOUNTER — Other Ambulatory Visit: Payer: Self-pay

## 2017-05-17 ENCOUNTER — Encounter: Payer: Self-pay | Admitting: Emergency Medicine

## 2017-05-17 DIAGNOSIS — I10 Essential (primary) hypertension: Secondary | ICD-10-CM | POA: Insufficient documentation

## 2017-05-17 DIAGNOSIS — L0231 Cutaneous abscess of buttock: Secondary | ICD-10-CM | POA: Insufficient documentation

## 2017-05-17 DIAGNOSIS — Z5189 Encounter for other specified aftercare: Secondary | ICD-10-CM | POA: Insufficient documentation

## 2017-05-17 DIAGNOSIS — F17210 Nicotine dependence, cigarettes, uncomplicated: Secondary | ICD-10-CM | POA: Insufficient documentation

## 2017-05-17 NOTE — ED Notes (Signed)
See triage note  Present for wound check  Had abscess lanced 2 days ago

## 2017-05-17 NOTE — ED Triage Notes (Signed)
Here for wound recheck to buttocks. Was packed 2 days ago and told to come back for check today. Reports is improving. Has been taking abx as prescribed.

## 2017-05-17 NOTE — ED Provider Notes (Signed)
[facility] Emergency Department Provider Note  ____________________________________________  Time seen: Approximately 10:50 AM  I have reviewed the triage vital signs and the nursing notes.   HISTORY  Chief Complaint Wound Check   HPI Shawni E Sharene SkeansWelker is a 37 y.o. female who presents to the emergency department for wound check. She had an abscess I&D here 2 days ago and was advised to return today for packing removal and recheck.  Patient states that she has had significant amount of drainage in the area continues to drain quite a bit.  Pain has lessened significantly since the I&D.  She denies new complaints.  Past Medical History:  Diagnosis Date  . Hypertension     There are no active problems to display for this patient.   History reviewed. No pertinent surgical history.  Prior to Admission medications   Medication Sig Start Date End Date Taking? Authorizing Provider  acetaminophen-codeine (TYLENOL #3) 300-30 MG tablet Take 1 tablet by mouth every 6 (six) hours as needed for moderate pain. 05/15/17   Tommi RumpsSummers, Rhonda L, PA-C  sulfamethoxazole-trimethoprim (BACTRIM DS,SEPTRA DS) 800-160 MG tablet Take 1 tablet by mouth 2 (two) times daily. 05/15/17   Tommi RumpsSummers, Rhonda L, PA-C    Allergies Bee venom and Other  History reviewed. No pertinent family history.  Social History Social History   Tobacco Use  . Smoking status: Current Every Day Smoker    Packs/day: 0.50    Types: Cigarettes  . Smokeless tobacco: Never Used  Substance Use Topics  . Alcohol use: No  . Drug use: No    Review of Systems Constitutional: Negative for fever. ENT: Negative for sore throat. Respiratory: Negative for cough Gastrointestinal: No abdominal pain.  No nausea, no vomiting.  No diarrhea.  Musculoskeletal: Negative for generalized body aches. Skin: Positive for incision and drainage site on right buttock Neurological: Negative for headaches, focal weakness or  numbness.  ____________________________________________   PHYSICAL EXAM:  VITAL SIGNS: ED Triage Vitals  Enc Vitals Group     BP 05/17/17 0843 (!) 133/91     Pulse Rate 05/17/17 0843 100     Resp 05/17/17 0843 20     Temp 05/17/17 0843 97.7 F (36.5 C)     Temp Source 05/17/17 0843 Oral     SpO2 05/17/17 0843 96 %     Weight 05/17/17 0844 108 lb (49 kg)     Height 05/17/17 0844 5\' 7"  (1.702 m)     Head Circumference --      Peak Flow --      Pain Score 05/17/17 0848 2     Pain Loc --      Pain Edu? --      Excl. in GC? --     Constitutional: Alert and oriented. Well appearing and in no acute distress. Eyes: Conjunctivae are normal. PERRL. EOMI. Head: Atraumatic. Nose: No congestion/rhinnorhea. Mouth/Throat: Mucous membranes are moist. Neck: No stridor.  Cardiovascular: Normal rate, regular rhythm. Good peripheral circulation. Respiratory: Normal respiratory effort. Musculoskeletal: Full ROM throughout.  Neurologic:  Normal speech and language. No gross focal neurologic deficits are appreciated. Speech is normal. No gait instability. Skin:  Skin is warm, dry and intact. No rash noted. Psychiatric: Mood and affect are normal. Speech and behavior are normal.  ____________________________________________   LABS (all labs ordered are listed, but only abnormal results are displayed)  Labs Reviewed - No data to display ____________________________________________  EKG  Not indicated ____________________________________________  RADIOLOGY  Not indicated ____________________________________________  PROCEDURES  Iodoform packing was removed and wound was repacked ____________________________________________   INITIAL IMPRESSION / ASSESSMENT AND PLAN / ED COURSE     Pertinent labs & imaging results that were available during my care of the patient were reviewed by me and considered in my medical decision making (see chart for details).  Patient was  advised to pull the packing out in 2 days if the area has stopped draining.  She was advised to leave it in place and come back to the emergency department if the area is still draining. She is to continue the antibiotic until finished.   ____________________________________________   FINAL CLINICAL IMPRESSION(S) / ED DIAGNOSES  Final diagnoses:  Abscess re-check       Chinita Pester, FNP 05/17/17 1540    Darci Current, MD 05/18/17 [phone]

## 2019-07-11 ENCOUNTER — Other Ambulatory Visit: Payer: Self-pay

## 2019-07-11 ENCOUNTER — Encounter: Payer: Self-pay | Admitting: Emergency Medicine

## 2019-07-11 ENCOUNTER — Emergency Department
Admission: EM | Admit: 2019-07-11 | Discharge: 2019-07-11 | Disposition: A | Discharge status: Home / Self Care | Payer: Self-pay | Attending: Emergency Medicine | Admitting: Emergency Medicine

## 2019-07-11 ENCOUNTER — Emergency Department: Payer: Self-pay

## 2019-07-11 DIAGNOSIS — R0789 Other chest pain: Secondary | ICD-10-CM | POA: Insufficient documentation

## 2019-07-11 DIAGNOSIS — I10 Essential (primary) hypertension: Secondary | ICD-10-CM | POA: Insufficient documentation

## 2019-07-11 DIAGNOSIS — R1013 Epigastric pain: Secondary | ICD-10-CM | POA: Insufficient documentation

## 2019-07-11 DIAGNOSIS — F419 Anxiety disorder, unspecified: Secondary | ICD-10-CM | POA: Insufficient documentation

## 2019-07-11 DIAGNOSIS — R222 Localized swelling, mass and lump, trunk: Secondary | ICD-10-CM | POA: Insufficient documentation

## 2019-07-11 DIAGNOSIS — F17210 Nicotine dependence, cigarettes, uncomplicated: Secondary | ICD-10-CM | POA: Insufficient documentation

## 2019-07-11 LAB — CBC
HCT: 43.5 % (ref 36.0–46.0)
Hemoglobin: 15.2 g/dL — ABNORMAL HIGH (ref 12.0–15.0)
MCH: 35.4 pg — ABNORMAL HIGH (ref 26.0–34.0)
MCHC: 34.9 g/dL (ref 30.0–36.0)
MCV: 101.4 fL — ABNORMAL HIGH (ref 80.0–100.0)
Platelets: 267 10*3/uL (ref 150–400)
RBC: 4.29 MIL/uL (ref 3.87–5.11)
RDW: 11.4 % — ABNORMAL LOW (ref 11.5–15.5)
WBC: 7.2 10*3/uL (ref 4.0–10.5)
nRBC: 0 % (ref 0.0–0.2)

## 2019-07-11 LAB — BASIC METABOLIC PANEL
Anion gap: 12 (ref 5–15)
BUN: 9 mg/dL (ref 6–20)
CO2: 24 mmol/L (ref 22–32)
Calcium: 9 mg/dL (ref 8.9–10.3)
Chloride: 101 mmol/L (ref 98–111)
Creatinine, Ser: 0.84 mg/dL (ref 0.44–1.00)
GFR calc Af Amer: >60 mL/min (ref >60)
GFR calc non Af Amer: >60 mL/min (ref >60)
Glucose, Bld: 124 mg/dL — ABNORMAL HIGH (ref 70–99)
Potassium: 4 mmol/L (ref 3.5–5.1)
Sodium: 137 mmol/L (ref 135–145)

## 2019-07-11 LAB — TROPONIN I (HIGH SENSITIVITY)
Troponin I (High Sensitivity): 2 ng/L (ref <18)
Troponin I (High Sensitivity): <2 ng/L (ref <18)

## 2019-07-11 MED ORDER — PANTOPRAZOLE SODIUM 40 MG PO TBEC
40.0000 mg | DELAYED_RELEASE_TABLET | Freq: Every day | ORAL | 0 refills | Status: DC
Start: 2019-07-11 — End: 2020-06-01

## 2019-07-11 MED ORDER — LIDOCAINE VISCOUS HCL 2 % MT SOLN
15.0000 mL | Freq: Once | OROMUCOSAL | Status: AC
  Administered 2019-07-11: 15 mL via ORAL
  Filled 2019-07-11: qty 15

## 2019-07-11 MED ORDER — ALUM & MAG HYDROXIDE-SIMETH 200-200-20 MG/5ML PO SUSP
30.0000 mL | Freq: Once | ORAL | Status: AC
  Administered 2019-07-11: 30 mL via ORAL
  Filled 2019-07-11: qty 30

## 2019-07-11 MED ORDER — SUCRALFATE 1 G PO TABS
1.0000 g | ORAL_TABLET | Freq: Three times a day (TID) | ORAL | 0 refills | Status: DC
Start: 2019-07-11 — End: 2020-06-01

## 2019-07-11 MED ORDER — LORAZEPAM 1 MG PO TABS
1.0000 mg | ORAL_TABLET | Freq: Once | ORAL | Status: AC
  Administered 2019-07-11: 1 mg via ORAL
  Filled 2019-07-11: qty 1

## 2019-07-11 NOTE — ED Notes (Signed)
This RN at bedside with EDP Isaacs while he is assessing L chest/breast.

## 2019-07-11 NOTE — ED Notes (Signed)
Pt states her anxiety has dec since this morning. Resp reg/unlabored. Skin dry. Calmly sitting in bed.

## 2019-07-11 NOTE — ED Triage Notes (Signed)
Pt from home with chest pain x 2 days. Pt states she was sick in November (not sure if it was Covid) and her breathing has not been the same since (intermittently). She reports worsening of sob in last 2 days. Pt states that her heart is visibly enlarged and she is concerned. Pt has hx of anxiety, but that this feels different. Pt alert & oriented, nad noted.

## 2019-07-11 NOTE — ED Provider Notes (Signed)
[facility] Emergency Department Provider Note  ____________________________________________   First MD Initiated Contact with Patient 07/11/19 1714     (approximate)  I have reviewed the triage vital signs and the nursing notes.   HISTORY  Chief Complaint Chest Pain    HPI Karen E Sharene Ortega is a 39 y.o. female here with chest pain.  The patient states that for the last day, she has had intermittent sensation of epigastric and left-sided chest pain which she describes as both a tingling/stabbing sensation as well as mild sensation of a fullness going up towards her throat.  She states that she has a history of anxiety and panic attacks and when this came on, she began to feel extremely anxious, and she feels like this is worsened her symptoms.  She also feels like she has had a swelling area along the medial aspect of her left breast.  She does not know how long this has been there but is concerned that something drained from her sinuses"  Denies any redness.  No warmth. No skin lesions. No trauma.       Past Medical History:  Diagnosis Date  . Hypertension     There are no problems to display for this patient.   History reviewed. No pertinent surgical history.  Prior to Admission medications   Medication Sig Start Date End Date Taking? Authorizing Provider  pantoprazole (PROTONIX) 40 MG tablet Take 1 tablet (40 mg total) by mouth daily for 14 days. 07/11/19 07/25/19  Shaune Pollack, MD  sucralfate (CARAFATE) 1 g tablet Take 1 tablet (1 g total) by mouth 4 (four) times daily -  with meals and at bedtime for 5 days. 07/11/19 07/16/19  Shaune Pollack, MD    Allergies Bee venom and Other  No family history on file.  Social History Social History   Tobacco Use  . Smoking status: Current Every Day Smoker    Packs/day: 0.50    Types: Cigarettes  . Smokeless tobacco: Never Used  Vaping Use  . Vaping Use: Never used  Substance Use Topics  . Alcohol  use: No    Comment: 5 per day  . Drug use: No    Review of Systems  Review of Systems  Constitutional: Negative for chills and fever.  HENT: Negative for sore throat.   Respiratory: Positive for chest tightness. Negative for shortness of breath.   Cardiovascular: Positive for chest pain and palpitations.  Gastrointestinal: Negative for abdominal pain.  Genitourinary: Negative for flank pain.  Musculoskeletal: Negative for neck pain.  Skin: Negative for rash and wound.  Allergic/Immunologic: Negative for immunocompromised state.  Neurological: Negative for weakness and numbness.  Hematological: Does not bruise/bleed easily.  Psychiatric/Behavioral: The patient is nervous/anxious.      ____________________________________________  PHYSICAL EXAM:      VITAL SIGNS: ED Triage Vitals  Enc Vitals Group     BP 07/11/19 1141 (!) 150/106     Pulse Rate 07/11/19 1141 (!) 127     Resp 07/11/19 1141 20     Temp 07/11/19 1141 98.3 F (36.8 C)     Temp Source 07/11/19 1141 Oral     SpO2 07/11/19 1141 100 %     Weight 07/11/19 1142 115 lb (52.2 kg)     Height 07/11/19 1142 5\' 7"  (1.702 m)     Head Circumference --      Peak Flow --      Pain Score 07/11/19 1142 5     Pain  Loc --      Pain Edu? --      Excl. in GC? --      Physical Exam Vitals and nursing note reviewed.  Constitutional:      General: She is not in acute distress.    Appearance: She is well-developed.  HENT:     Head: Normocephalic and atraumatic.  Eyes:     Conjunctiva/sclera: Conjunctivae normal.     Pupils: Pupils are equal, round, and reactive to light.  Cardiovascular:     Rate and Rhythm: Normal rate and regular rhythm.     Heart sounds: Normal heart sounds.  Pulmonary:     Effort: Pulmonary effort is normal. No respiratory distress.     Breath sounds: No wheezing.  Chest:     Comments: Breast exam performed w/ chaperone. No swelling, masses, nodules appreciated. No nipple inversion or  lesions. Abdominal:     General: There is no distension.  Musculoskeletal:     Cervical back: Neck supple.  Skin:    General: Skin is warm.     Capillary Refill: Capillary refill takes less than 2 seconds.     Findings: No rash.  Neurological:     Mental Status: She is alert and oriented to person, place, and time.     Motor: No abnormal muscle tone.       ____________________________________________   LABS (all labs ordered are listed, but only abnormal results are displayed)  Labs Reviewed  BASIC METABOLIC PANEL - Abnormal; Notable for the following components:      Result Value   Glucose, Bld 124 (*)    All other components within normal limits  CBC - Abnormal; Notable for the following components:   Hemoglobin 15.2 (*)    MCV 101.4 (*)    MCH 35.4 (*)    RDW 11.4 (*)    All other components within normal limits  POC URINE PREG, ED  TROPONIN I (HIGH SENSITIVITY)  TROPONIN I (HIGH SENSITIVITY)    ____________________________________________  EKG: Sinus tachycardia, ventricular rate 114.  PR 124, QRS 76, QTc 454.  No acute ST elevations or depressions.  No EKG evidence of acute ischemia or infarct. ________________________________________  RADIOLOGY All imaging, including plain films, CT scans, and ultrasounds, independently reviewed by me, and interpretations confirmed via formal radiology reads.  ED MD interpretation:   Chest x-ray: Lungs clear, cardiac silhouette within normal limits  Official radiology report(s): DG Chest 2 View  Result Date: 07/11/2019 CLINICAL DATA:  Chest pain and shortness of breath EXAM: CHEST - 2 VIEW COMPARISON:  October 29, 2003 FINDINGS: Lungs are clear. Heart size and pulmonary vascularity normal. No adenopathy. No pneumothorax. No bone lesions. IMPRESSION: Lungs clear.  Cardiac silhouette within normal limits. Electronically Signed   By: Bretta Bang III M.D.   On: 07/11/2019 12:46     ____________________________________________  PROCEDURES   Procedure(s) performed (including Critical Care):  Procedures  ____________________________________________  INITIAL IMPRESSION / MDM / ASSESSMENT AND PLAN / ED COURSE  As part of my medical decision making, I reviewed the following data within the electronic MEDICAL RECORD NUMBER Nursing notes reviewed and incorporated, Old chart reviewed, Notes from prior ED visits, and [place] Controlled Substance Database       *Triana E Welker was evaluated in Emergency Department on 07/11/2019 for the symptoms described in the history of present illness. She was evaluated in the context of the global COVID-19 pandemic, which necessitated consideration that the patient might be at risk for  infection with the SARS-CoV-2 virus that causes COVID-19. Institutional protocols and algorithms that pertain to the evaluation of patients at risk for COVID-19 are in a state of rapid change based on information released by regulatory bodies including the CDC and federal and state organizations. These policies and algorithms were followed during the patient's care in the ED.  Some ED evaluations and interventions may be delayed as a result of limited staffing during the pandemic.*     Medical Decision Making:  39 yo F here with atypical chest pain. DDx includes GI-related pain as she has a h/o GERD, some pain w/ swallowing, versus anxiety. Less likely chest wall pain. No apparent emergent pathology. EKG is nonischemic and trop neg despite constant sx, normal EKG - doubt ACS. She is not tachycardic in room, no pleurisy, no leg swelling, no s/s DVT or PE. Pain si not c/w dissection. She has had improvement w/ GI cocktail and ativan. Will have her trial antacids, d/c home.  Re: her reported breast/chest wall swelling - no appreciable swelling on my exam. No signs of nodule or masses on breast exam. CXR is clear. Discussed following up wth PCP for outpt mammogram or  U/S.  ____________________________________________  FINAL CLINICAL IMPRESSION(S) / ED DIAGNOSES  Final diagnoses:  Atypical chest pain  Anxiety     MEDICATIONS GIVEN DURING THIS VISIT:  Medications  alum & mag hydroxide-simeth (MAALOX/MYLANTA) 200-200-20 MG/5ML suspension 30 mL (30 mLs Oral Given 07/11/19 1811)    And  lidocaine (XYLOCAINE) 2 % viscous mouth solution 15 mL (15 mLs Oral Given 07/11/19 1811)  LORazepam (ATIVAN) tablet 1 mg (1 mg Oral Given 07/11/19 1811)     ED Discharge Orders         Ordered    sucralfate (CARAFATE) 1 g tablet  3 times daily with meals & bedtime     Discontinue  Reprint     07/11/19 1857    pantoprazole (PROTONIX) 40 MG tablet  Daily     Discontinue  Reprint     07/11/19 1857           Note:  This document was prepared using Dragon voice recognition software and may include unintentional dictation errors.   Duffy Bruce, MD 07/11/19 2021

## 2019-11-04 NOTE — Progress Notes (Signed)
A Televisit was used to contact patient and review health history.  2 patient identifiers used to confirm I was speaking to the the correct patient.  Patient is a self referral for complaints of left breast pain and "swelling".  States she was was seen in the ED for same complaints in June.  States she feels like the swelling and pain has gotten worse.  She has been using a "heating pad" for discomfort.  States she has left breast pain that radiates to her back.  Rates her pain a 9 at times.  States it feels like there is "fluid" in her breast.  She feels like there may be a lump at the medial left breast.  Patient is tearful and states she is "sad".  She was informed to go directly to the [facility] in the morning at 9:30 for her mammogram.    Patient presented to [facility] this morning for diagnostic imaging.

## 2019-11-05 ENCOUNTER — Ambulatory Visit
Admission: RE | Admit: 2019-11-05 | Discharge: 2019-11-05 | Disposition: A | Discharge status: Home / Self Care | Payer: Self-pay | Source: Ambulatory Visit | Attending: Oncology | Admitting: Oncology

## 2019-11-05 ENCOUNTER — Ambulatory Visit: Payer: Self-pay | Attending: Oncology

## 2019-11-05 ENCOUNTER — Other Ambulatory Visit: Payer: Self-pay

## 2019-11-05 DIAGNOSIS — N63 Unspecified lump in unspecified breast: Secondary | ICD-10-CM

## 2019-11-22 NOTE — Progress Notes (Signed)
Radiologist reviewed normal imaging results with patient, and recommended annual mammogram.  Copy to [facility].

## 2019-11-26 ENCOUNTER — Encounter: Payer: Self-pay | Admitting: *Deleted

## 2019-11-26 NOTE — Progress Notes (Signed)
Patient called and is still concerned about the left breast swelling and tingling in her left nipple.  States "I know my mammogram was normal, but they didn't do an ultrasound, and I am really concerned".  Offered patient a surgical consult for further evaluation.  I have scheduled her to see Dr. Lemar Livings on December 24, 2019 @ 9:30 per patient's request for a morning appointment.

## 2020-01-28 ENCOUNTER — Telehealth: Payer: Self-pay | Admitting: Nurse Practitioner

## 2020-01-28 DIAGNOSIS — J00 Acute nasopharyngitis [common cold]: Secondary | ICD-10-CM

## 2020-01-28 MED ORDER — FLUTICASONE PROPIONATE 50 MCG/ACT NA SUSP
2.0000 | Freq: Every day | NASAL | 6 refills | Status: DC
Start: 2020-01-28 — End: 2020-06-04

## 2020-01-28 MED ORDER — BENZONATATE 100 MG PO CAPS
100.0000 mg | ORAL_CAPSULE | Freq: Three times a day (TID) | ORAL | 0 refills | Status: DC | PRN
Start: 2020-01-28 — End: 2020-06-04

## 2020-01-28 NOTE — Progress Notes (Signed)

## 2020-03-02 ENCOUNTER — Encounter: Payer: Self-pay | Admitting: Cardiology

## 2020-04-06 ENCOUNTER — Encounter: Payer: Self-pay | Admitting: Cardiology

## 2020-04-06 DIAGNOSIS — K219 Gastro-esophageal reflux disease without esophagitis: Secondary | ICD-10-CM | POA: Insufficient documentation

## 2020-04-06 DIAGNOSIS — F419 Anxiety disorder, unspecified: Secondary | ICD-10-CM | POA: Insufficient documentation

## 2020-04-23 DIAGNOSIS — R079 Chest pain, unspecified: Secondary | ICD-10-CM | POA: Insufficient documentation

## 2020-04-23 DIAGNOSIS — E559 Vitamin D deficiency, unspecified: Secondary | ICD-10-CM | POA: Insufficient documentation

## 2020-04-23 DIAGNOSIS — I700 Atherosclerosis of aorta: Secondary | ICD-10-CM | POA: Insufficient documentation

## 2020-04-29 ENCOUNTER — Other Ambulatory Visit: Payer: Self-pay

## 2020-04-29 ENCOUNTER — Ambulatory Visit: Payer: BC Managed Care – PPO | Admitting: Cardiology

## 2020-04-29 ENCOUNTER — Encounter: Payer: Self-pay | Admitting: Cardiology

## 2020-04-29 VITALS — BP 134/96 | HR 91 | Ht 66.0 in | Wt 121.8 lb

## 2020-04-29 DIAGNOSIS — I700 Atherosclerosis of aorta: Secondary | ICD-10-CM

## 2020-04-29 DIAGNOSIS — I259 Chronic ischemic heart disease, unspecified: Secondary | ICD-10-CM

## 2020-04-29 DIAGNOSIS — F17210 Nicotine dependence, cigarettes, uncomplicated: Secondary | ICD-10-CM | POA: Diagnosis not present

## 2020-04-29 DIAGNOSIS — E559 Vitamin D deficiency, unspecified: Secondary | ICD-10-CM | POA: Diagnosis not present

## 2020-04-29 DIAGNOSIS — Z1329 Encounter for screening for other suspected endocrine disorder: Secondary | ICD-10-CM

## 2020-04-29 DIAGNOSIS — I209 Angina pectoris, unspecified: Secondary | ICD-10-CM

## 2020-04-29 MED ORDER — ASPIRIN EC 81 MG PO TBEC: 81.0000 mg | DELAYED_RELEASE_TABLET | Freq: Every day | ORAL | 3 refills | Status: DC

## 2020-04-29 MED ORDER — NITROGLYCERIN 0.4 MG SL SUBL: 0.4000 mg | SUBLINGUAL_TABLET | SUBLINGUAL | 6 refills | Status: DC | PRN

## 2020-04-29 MED ORDER — METOPROLOL TARTRATE 100 MG PO TABS: 100.0000 mg | ORAL_TABLET | Freq: Once | ORAL | 0 refills | Status: DC

## 2020-04-29 NOTE — Patient Instructions (Signed)
Medication Instructions:  Your physician has recommended you make the following change in your medication:   Take 81 mg coated aspirin daily. Use nitroglycerin as needed for chest pain.  *If you need a refill on your cardiac medications before your next appointment, please call your pharmacy*   Lab Work: Your physician recommends that you return for lab work in: the next few days. You need to have labs done when you are fasting.  You can come Monday through Friday 8:30 am to 12:00 pm and 1:15 to 4:30. You do not need to make an appointment as the order has already been placed. The labs you are going to have done are BMET, CBC, TSH, LFT and Lipids.  If you have labs (blood work) drawn today and your tests are completely normal, you will receive your results only by: Marland Kitchen MyChart Message (if you have MyChart) OR . A paper copy in the mail If you have any lab test that is abnormal or we need to change your treatment, we will call you to review the results.   Testing/Procedures: Your cardiac CT will be scheduled at:   [facility] [place], [place] [address] [phone]   If scheduled at [facility], please arrive at the [facility] main entrance of [facility] 30 minutes prior to test start time. Proceed to the [facility] Radiology Department (first floor) to check-in and test prep.  Please follow these instructions carefully (unless otherwise directed):  On the Night Before the Test: . Be sure to Drink plenty of water. . Do not consume any caffeinated/decaffeinated beverages or chocolate 12 hours prior to your test. . Do not take any antihistamines 12 hours prior to your test.   On the Day of the Test: . Drink plenty of water. Do not drink any water within one hour of the test. . Do not eat any food 4 hours prior to the test. . You may take your regular medications prior to the test.  . Take metoprolol (Lopressor) two hours prior to  test. . FEMALES- please wear underwire-free bra if available      After the Test: . Drink plenty of water. . After receiving IV contrast, you may experience a mild flushed feeling. This is normal. . On occasion, you may experience a mild rash up to 24 hours after the test. This is not dangerous. If this occurs, you can take Benadryl 25 mg and increase your fluid intake. . If you experience trouble breathing, this can be serious. If it is severe call 911 IMMEDIATELY. If it is mild, please call our office. . If you take any of these medications: Glipizide/Metformin, Avandament, Glucavance, please do not take 48 hours after completing test unless otherwise instructed.   Once we have confirmed authorization from your insurance company, we will call you to set up a date and time for your test. Based on how quickly your insurance processes prior authorizations requests, please allow up to 4 weeks to be contacted for scheduling your Cardiac CT appointment. Be advised that routine Cardiac CT appointments could be scheduled as many as 8 weeks after your provider has ordered it.  For non-scheduling related questions, please contact the cardiac imaging nurse navigator should you have any questions/concerns: Marchia Bond, Cardiac Imaging Nurse Navigator Burley Saver, Interim Cardiac Imaging Nurse [place] [facility] Direct Office Dial: [phone]   For scheduling needs, including cancellations and rescheduling, please call Vivien Rota at [phone].  Follow-Up: At [facility], you and your health needs are our priority.  As part of our continuing mission to provide you with exceptional heart care, we have created designated Provider Care Teams.  These Care Teams include your primary Cardiologist (physician) and Advanced Practice Providers (APPs -  Physician Assistants and Nurse Practitioners) who all work together to provide you with the care you need, when you need it.  We  recommend signing up for the patient portal called "MyChart".  Sign up information is provided on this After Visit Summary.  MyChart is used to connect with patients for Virtual Visits (Telemedicine).  Patients are able to view lab/test results, encounter notes, upcoming appointments, etc.  Non-urgent messages can be sent to your provider as well.   To learn more about what you can do with MyChart, go to [email or web address].    Your next appointment:   2 month(s)  The format for your next appointment:   In Person  Provider:   Jyl Heinz, MD   Other Instructions Cardiac CT Angiogram A cardiac CT angiogram is a procedure to look at the heart and the area around the heart. It may be done to help find the cause of chest pains or other symptoms of heart disease. During this procedure, a substance called contrast dye is injected into the blood vessels in the area to be checked. A large X-ray machine, called a CT scanner, then takes detailed pictures of the heart and the surrounding area. The procedure is also sometimes called a coronary CT angiogram, coronary artery scanning, or CTA. A cardiac CT angiogram allows the health care provider to see how well blood is flowing to and from the heart. The health care provider will be able to see if there are any problems, such as:  Blockage or narrowing of the coronary arteries in the heart.  Fluid around the heart.  Signs of weakness or disease in the muscles, valves, and tissues of the heart. Tell a health care provider about:  Any allergies you have. This is especially important if you have had a previous allergic reaction to contrast dye.  All medicines you are taking, including vitamins, herbs, eye drops, creams, and over-the-counter medicines.  Any blood disorders you have.  Any surgeries you have had.  Any medical conditions you have.  Whether you are pregnant or may be pregnant.  Any anxiety disorders, chronic pain, or other  conditions you have that may increase your stress or prevent you from lying still. What are the risks? Generally, this is a safe procedure. However, problems may occur, including: 1. Bleeding. 2. Infection. 3. Allergic reactions to medicines or dyes. 4. Damage to other structures or organs. 5. Kidney damage from the contrast dye that is used. 6. Increased risk of cancer from radiation exposure. This risk is low. Talk with your health care provider about: ? The risks and benefits of testing. ? How you can receive the lowest dose of radiation. What happens before the procedure? 1. Wear comfortable clothing and remove any jewelry, glasses, dentures, and hearing aids. 2. Follow instructions from your health care provider about eating and drinking. This may include: ? For 12 hours before the procedure -- avoid caffeine. This includes tea, coffee, soda, energy drinks, and diet pills. Drink plenty of water or other fluids that do not have caffeine in them. Being well hydrated can prevent complications. ? For 4-6 hours before the procedure -- stop eating and drinking. The contrast dye can cause nausea, but this  is less likely if your stomach is empty. 3. Ask your health care provider about changing or stopping your regular medicines. This is especially important if you are taking diabetes medicines, blood thinners, or medicines to treat problems with erections (erectile dysfunction). What happens during the procedure?  1. Hair on your chest may need to be removed so that small sticky patches called electrodes can be placed on your chest. These will transmit information that helps to monitor your heart during the procedure. 2. An IV will be inserted into one of your veins. 3. You might be given a medicine to control your heart rate during the procedure. This will help to ensure that good images are obtained. 4. You will be asked to lie on an exam table. This table will slide in and out of the CT machine  during the procedure. 5. Contrast dye will be injected into the IV. You might feel warm, or you may get a metallic taste in your mouth. 6. You will be given a medicine called nitroglycerin. This will relax or dilate the arteries in your heart. 7. The table that you are lying on will move into the CT machine tunnel for the scan. 8. The person running the machine will give you instructions while the scans are being done. You may be asked to: ? Keep your arms above your head. ? Hold your breath. ? Stay very still, even if the table is moving. 9. When the scanning is complete, you will be moved out of the machine. 10. The IV will be removed. The procedure may vary among health care providers and hospitals. What can I expect after the procedure? After your procedure, it is common to have:  A metallic taste in your mouth from the contrast dye.  A feeling of warmth.  A headache from the nitroglycerin. Follow these instructions at home:  Take over-the-counter and prescription medicines only as told by your health care provider.  If you are told, drink enough fluid to keep your urine pale yellow. This will help to flush the contrast dye out of your body.  Most people can return to their normal activities right after the procedure. Ask your health care provider what activities are safe for you.  It is up to you to get the results of your procedure. Ask your health care provider, or the department that is doing the procedure, when your results will be ready.  Keep all follow-up visits as told by your health care provider. This is important. Contact a health care provider if: 1. You have any symptoms of allergy to the contrast dye. These include: ? Shortness of breath. ? Rash or hives. ? A racing heartbeat. Summary  A cardiac CT angiogram is a procedure to look at the heart and the area around the heart. It may be done to help find the cause of chest pains or other symptoms of heart  disease.  During this procedure, a large X-ray machine, called a CT scanner, takes detailed pictures of the heart and the surrounding area after a contrast dye has been injected into blood vessels in the area.  Ask your health care provider about changing or stopping your regular medicines before the procedure. This is especially important if you are taking diabetes medicines, blood thinners, or medicines to treat erectile dysfunction.  If you are told, drink enough fluid to keep your urine pale yellow. This will help to flush the contrast dye out of your body. This information is not  intended to replace advice given to you by your health care provider. Make sure you discuss any questions you have with your health care provider. Document Revised: 09/12/2018 Document Reviewed: 09/12/2018 Elsevier Patient Education  [place].   Aspirin and Your Heart Aspirin is a medicine that prevents the platelets in your blood from sticking together. Platelets are the cells that your blood uses for clotting. Aspirin can be used to help reduce the risk of blood clots, heart attacks, and other heart-related problems. What are the risks? Daily use of aspirin can cause side effects. Some of these include:  Bleeding. Bleeding can be minor or serious. An example of minor bleeding is bleeding from a cut, and the bleeding does not stop. An example of more serious bleeding is stomach bleeding or, rarely, bleeding into the brain. Your risk of bleeding increases if you are also taking NSAIDs, such as ibuprofen.  Increased bruising.  Upset stomach.  An allergic reaction. People who have growths inside the nose (nasal polyps) have an increased risk of developing an aspirin allergy. How to use aspirin to care for your heart  Take aspirin only as told by your health care provider. Make sure that you understand how much to take and what form to take. The two forms of aspirin are: ? Non-enteric-coated.This type  of aspirin does not have a coating and is absorbed quickly. This type of aspirin also comes in a chewable form. ? Enteric-coated. This type of aspirin has a coating that releases the medicine very slowly. Enteric-coated aspirin might cause less stomach upset than non-enteric-coated aspirin. This type of aspirin should not be chewed or crushed.  Work with your health care provider to find out whether it is safe and beneficial for you to take aspirin daily. Taking aspirin daily may be helpful if: ? You have had a heart attack or chest pain, or you are at risk for a heart attack. ? You have a condition in which certain heart vessels are blocked (coronary artery disease), and you have had a procedure to treat it. Examples are:  Open-heart surgery, such as coronary artery bypass surgery (CABG).  Coronary angioplasty,which is done to widen a blood vessel of your heart.  Having a small mesh tube, or stent, placed in your coronary artery. ? You have had certain types of stroke or a mini-stroke known as a transient ischemic attack (TIA). ? You have a narrowing of the arteries that supply the limbs (peripheral artery disease, or PAD). ? You have long-term (chronic) heart rhythm problems, such as atrial fibrillation, and your health care provider thinks aspirin may help. ? You have valve disease or have had surgery on a valve. ? You are considered at increased risk of developing coronary artery disease or PAD.   Follow these instructions at home Medicines  Take over-the-counter and prescription medicines only as told by your health care provider.  If you are taking blood thinners: ? Talk with your health care provider before you take any medicines that contain aspirin or NSAIDs, such as ibuprofen. These medicines increase your risk for dangerous bleeding. ? Take your medicine exactly as told, at the same time every day. ? Avoid activities that could cause injury or bruising, and follow instructions  about how to prevent falls. ? Wear a medical alert bracelet or carry a card that lists what medicines you take. General instructions  Do not drink alcohol if: ? Your health care provider tells you not to drink. ? You are  pregnant, may be pregnant, or are planning to become pregnant.  If you drink alcohol: ? Limit how much you use to:  0-1 drink a day for women.  0-2 drinks a day for men. ? Be aware of how much alcohol is in your drink. In the [place] one drink equals one 12 oz bottle of beer (355 mL), one 5 oz glass of wine (148 mL), or one 1 oz glass of hard liquor (44 mL).  Keep all follow-up visits as told by your health care provider. This is important. Where to find more information  The American Heart Association: www.heart.org Contact a health care provider if you have:  Unusual bleeding or bruising.  Stomach pain or nausea.  Ringing in your ears.  An allergic reaction that causes hives, itchy skin, or swelling of the lips, tongue, or face. Get help right away if:  You notice that your bowel movements are bloody, or dark red or black in color.  You vomit or cough up blood.  You have blood in your urine.  You cough, breathe loudly (wheeze), or feel short of breath.  You have chest pain, especially if the pain spreads to your arms, back, neck, or jaw.  You have a headache with confusion. You have any symptoms of a stroke. "BE FAST" is an easy way to remember the main warning signs of a stroke:  B - Balance. Signs are dizziness, sudden trouble walking, or loss of balance.  E - Eyes. Signs are trouble seeing or a sudden change in vision.  F - Face. Signs are sudden weakness or numbness of the face, or the face or eyelid drooping on one side.  A - Arms. Signs are weakness or numbness in an arm. This happens suddenly and usually on one side of the body.  S - Speech. Signs are sudden trouble speaking, slurred speech, or trouble understanding what people say.  T -  Time. Time to call emergency services. Write down what time symptoms started. You have other signs of a stroke, such as:  A sudden, severe headache with no known cause.  Nausea or vomiting.  Seizure. These symptoms may represent a serious problem that is an emergency. Do not wait to see if the symptoms will go away. Get medical help right away. Call your local emergency services (911 in the [place] Do not drive yourself to the hospital. Summary  Aspirin use can help reduce the risk of blood clots, heart attacks, and other heart-related problems.  Daily use of aspirin can cause side effects.  Take aspirin only as told by your health care provider. Make sure that you understand how much to take and what form to take.  Your health care provider will help you determine whether it is safe and beneficial for you to take aspirin daily. This information is not intended to replace advice given to you by your health care provider. Make sure you discuss any questions you have with your health care provider. Document Revised: 10/22/2018 Document Reviewed: 10/22/2018 Elsevier Patient Education  2021 [place].   Nitroglycerin sublingual tablets What is this medicine? NITROGLYCERIN (nye troe GLI ser in) is a type of vasodilator. It relaxes blood vessels, increasing the blood and oxygen supply to your heart. This medicine is used to relieve chest pain caused by angina. It is also used to prevent chest pain before activities like climbing stairs, going outdoors in cold weather, or sexual activity. This medicine may be used for other purposes; ask your  health care provider or pharmacist if you have questions. COMMON BRAND NAME(S): Nitroquick, Nitrostat, Nitrotab What should I tell my health care provider before I take this medicine? They need to know if you have any of these conditions:  anemia  head injury, recent stroke, or bleeding in the brain  liver disease  previous heart attack  an  unusual or allergic reaction to nitroglycerin, other medicines, foods, dyes, or preservatives  pregnant or trying to get pregnant  breast-feeding How should I use this medicine? Take this medicine by mouth as needed. Use at the first sign of an angina attack (chest pain or tightness). You can also take this medicine 5 to 10 minutes before an event likely to produce chest pain. Follow the directions exactly as written on the prescription label. Place one tablet under your tongue and let it dissolve. Do not swallow whole. Replace the dose if you accidentally swallow it. It will help if your mouth is not dry. Saliva around the tablet will help it to dissolve more quickly. Do not eat or drink, smoke or chew tobacco while a tablet is dissolving. Sit down when taking this medicine. In an angina attack, you should feel better within 5 minutes after your first dose. You can take a dose every 5 minutes up to a total of 3 doses. If you do not feel better or feel worse after 1 dose, call 9-1-1 at once. Do not take more than 3 doses in 15 minutes. Your health care provider might give you other directions. Follow those directions if he or she does. Do not take your medicine more often than directed. Talk to your health care provider about the use of this medicine in children. Special care may be needed. Overdosage: If you think you have taken too much of this medicine contact a poison control center or emergency room at once. NOTE: This medicine is only for you. Do not share this medicine with others. What if I miss a dose? This does not apply. This medicine is only used as needed. What may interact with this medicine? Do not take this medicine with any of the following medications:  certain migraine medicines like ergotamine and dihydroergotamine (DHE)  medicines used to treat erectile dysfunction like sildenafil, tadalafil, and vardenafil  riociguat This medicine may also interact with the following  medications:  alteplase  aspirin  heparin  medicines for high blood pressure  medicines for mental depression  other medicines used to treat angina  phenothiazines like chlorpromazine, mesoridazine, prochlorperazine, thioridazine This list may not describe all possible interactions. Give your health care provider a list of all the medicines, herbs, non-prescription drugs, or dietary supplements you use. Also tell them if you smoke, drink alcohol, or use illegal drugs. Some items may interact with your medicine. What should I watch for while using this medicine? Tell your doctor or health care professional if you feel your medicine is no longer working. Keep this medicine with you at all times. Sit or lie down when you take your medicine to prevent falling if you feel dizzy or faint after using it. Try to remain calm. This will help you to feel better faster. If you feel dizzy, take several deep breaths and lie down with your feet propped up, or bend forward with your head resting between your knees. You may get drowsy or dizzy. Do not drive, use machinery, or do anything that needs mental alertness until you know how this drug affects you. Do not stand  or sit up quickly, especially if you are an older patient. This reduces the risk of dizzy or fainting spells. Alcohol can make you more drowsy and dizzy. Avoid alcoholic drinks. Do not treat yourself for coughs, colds, or pain while you are taking this medicine without asking your doctor or health care professional for advice. Some ingredients may increase your blood pressure. What side effects may I notice from receiving this medicine? Side effects that you should report to your doctor or health care professional as soon as possible:  allergic reactions (skin rash, itching or hives; swelling of the face, lips, or tongue)  low blood pressure (dizziness; feeling faint or lightheaded, falls; unusually weak or tired)  low red blood cell counts  (trouble breathing; feeling faint; lightheaded, falls; unusually weak or tired) Side effects that usually do not require medical attention (report to your doctor or health care professional if they continue or are bothersome):  facial flushing (redness)  headache  nausea, vomiting This list may not describe all possible side effects. Call your doctor for medical advice about side effects. You may report side effects to FDA at 1-800-FDA-1088. Where should I keep my medicine? Keep out of the reach of children. Store at room temperature between 20 and 25 degrees C (68 and 77 degrees F). Store in [employer]. Protect from light and moisture. Keep tightly closed. Throw away any unused medicine after the expiration date. NOTE: This sheet is a summary. It may not cover all possible information. If you have questions about this medicine, talk to your doctor, pharmacist, or health care provider.  2021 Elsevier/Gold Standard (2017-10-18 16:46:32)

## 2020-04-29 NOTE — Addendum Note (Signed)
Addended by: Eleonore Chiquito on: 04/29/2020 09:57 AM   Modules accepted: Orders

## 2020-04-29 NOTE — Progress Notes (Signed)
Cardiology Office Note:    Date:  04/29/2020   ID:  Karen Ortega, DOB 11-28-1980, MRN [id number]  PCP:  Eber Jones, NP  Cardiologist:  Garwin Brothers, MD   Referring MD: Dema Severin, NP    ASSESSMENT:    1. Atherosclerosis of aorta (HCC)   2. Angina pectoris (HCC)   3. Cigarette smoker    PLAN:    In order of problems listed above:  1. Angina pectoris: Primary prevention stressed with the patient.  Importance of compliance with diet medication stressed and she vocalized understanding.  I discussed various modalities of evaluation including stress testing, CT coronary angiography and conventional coronary angiography.  She decided to do the CT coronary angiography with [facility] and I will set her up for this.  Her symptoms are concerning.  I told her to take a coated aspirin on a daily basis.  Sublingual nitroglycerin prescription was sent, its protocol and 911 protocol explained and the patient vocalized understanding questions were answered to the patient's satisfaction.  She knows to go to the nearest emergency room for any concerning symptoms. 2. Cholesterol screening: I will do blood work in the next few days complete blood work to assess lipids also. 3. Cigarette smoker: I spent 5 minutes with the patient discussing solely about smoking. Smoking cessation was counseled. I suggested to the patient also different medications and pharmacological interventions. Patient is keen to try stopping on its own at this time. He will get back to me if he needs any further assistance in this matter. 4. Patient will be seen in follow-up appointment in 3 months or earlier if the patient has any concerns    Medication Adjustments/Labs and Tests Ordered: Current medicines are reviewed at length with the patient today.  Concerns regarding medicines are outlined above.  No orders of the defined types were placed in this encounter.  No orders of the defined types were placed in this  encounter.    History of Present Illness:    Karen Ortega is a 40 y.o. female who is being seen today for the evaluation of chest pain at the request of York, Regina F, NP.  Patient is a pleasant 40 year old female.  She has past medical history of cigarette smoking.  She denies any history of hypertension dyslipidemia or diabetes mellitus.  Matter-of-fact her cholesterol status is unknown known.  Unfortunately she is a heavy smoker since young age.  She mentions to me that when she is stressed she has some chest tightness.  She attributes to the neck into the arms.  She is concerned about this.  She does not exercise on a regular basis.  Sexual activity does not bring around the symptoms.  At the time of my evaluation, the patient is alert awake oriented and in no distress.  Past Medical History:  Diagnosis Date  . Anxiety disorder   . Atherosclerosis of aorta (HCC)   . Chest pain, unspecified   . Gastro-esophageal reflux disease without esophagitis   . Vitamin D deficiency     Past Surgical History:  Procedure Laterality Date  . NO PAST SURGERIES      Current Medications: Current Meds  Medication Sig  . Cholecalciferol (VITAMIN D-3) 125 MCG (5000 UT) TABS Take 1 tablet by mouth every other day.  . dimenhyDRINATE (DRAMAMINE PO) Take 1 tablet by mouth as needed (nausea and vomiting).  Marland Kitchen esomeprazole (NEXIUM) 20 MG capsule Take 20 mg by mouth 2 (two) times daily before a meal.  .  OVER THE COUNTER MEDICATION Take 1 tablet by mouth 2 (two) times daily. Anxiety and Stress Relief     Allergies:   Amoxicillin, Bee venom, and Other   Social History   Socioeconomic History  . Marital status: Married    Spouse name: Not on file  . Number of children: Not on file  . Years of education: Not on file  . Highest education level: Not on file  Occupational History  . Not on file  Tobacco Use  . Smoking status: Current Every Day Smoker    Packs/day: 0.50  . Smokeless tobacco: Never Used   Substance and Sexual Activity  . Alcohol use: Yes  . Drug use: Never  . Sexual activity: Not on file  Other Topics Concern  . Not on file  Social History Narrative  . Not on file   Social Determinants of Health   Financial Resource Strain: Not on file  Food Insecurity: Not on file  Transportation Needs: Not on file  Physical Activity: Not on file  Stress: Not on file  Social Connections: Not on file     Family History: The patient's family history includes Arthritis in her father and mother; Depression in her father and mother; Heart disease in her father and mother.  ROS:   Please see the history of present illness.    All other systems reviewed and are negative.  EKGs/Labs/Other Studies Reviewed:    The following studies were reviewed today: EKG reveals sinus rhythm with nonspecific ST-T changes   Recent Labs: No results found for requested labs within last 8760 hours.  Recent Lipid Panel No results found for: CHOL, TRIG, HDL, CHOLHDL, VLDL, LDLCALC, LDLDIRECT  Physical Exam:    VS:  BP (!) 134/96   Pulse 91   Ht 5\' 6"  (1.676 m)   Wt 121 lb 12.8 oz (55.2 kg)   SpO2 99%   BMI 19.66 kg/m     Wt Readings from Last 3 Encounters:  04/29/20 121 lb 12.8 oz (55.2 kg)  03/30/20 113 lb (51.3 kg)     GEN: Patient is in no acute distress HEENT: Normal NECK: No JVD; No carotid bruits LYMPHATICS: No lymphadenopathy CARDIAC: S1 S2 regular, 2/6 systolic murmur at the apex. RESPIRATORY:  Clear to auscultation without rales, wheezing or rhonchi  ABDOMEN: Soft, non-tender, non-distended MUSCULOSKELETAL:  No edema; No deformity  SKIN: Warm and dry NEUROLOGIC:  Alert and oriented x 3 PSYCHIATRIC:  Normal affect    Signed, 04/01/20, MD  04/29/2020 9:46 AM    [facility]

## 2020-05-04 LAB — BASIC METABOLIC PANEL
BUN/Creatinine Ratio: 11 (ref 9–23)
BUN: 8 mg/dL (ref 6–20)
CO2: 22 mmol/L (ref 20–29)
Calcium: 9.5 mg/dL (ref 8.7–10.2)
Chloride: 100 mmol/L (ref 96–106)
Creatinine, Ser: 0.72 mg/dL (ref 0.57–1.00)
Glucose: 86 mg/dL (ref 65–99)
Potassium: 4.2 mmol/L (ref 3.5–5.2)
Sodium: 139 mmol/L (ref 134–144)
eGFR: 109 mL/min/{1.73_m2} (ref >59)

## 2020-05-04 LAB — LIPID PANEL
Chol/HDL Ratio: 1.3 ratio (ref 0.0–4.4)
Cholesterol, Total: 186 mg/dL (ref 100–199)
HDL: 140 mg/dL (ref >39)
LDL Chol Calc (NIH): 34 mg/dL (ref 0–99)
Triglycerides: 66 mg/dL (ref 0–149)
VLDL Cholesterol Cal: 12 mg/dL (ref 5–40)

## 2020-05-04 LAB — TSH: TSH: 2.2 u[IU]/mL (ref 0.450–4.500)

## 2020-05-04 LAB — HEPATIC FUNCTION PANEL
ALT: 12 IU/L (ref 0–32)
AST: 16 IU/L (ref 0–40)
Albumin: 4.6 g/dL (ref 3.8–4.8)
Alkaline Phosphatase: 74 IU/L (ref 44–121)
Bilirubin Total: 0.5 mg/dL (ref 0.0–1.2)
Bilirubin, Direct: 0.19 mg/dL (ref 0.00–0.40)
Total Protein: 7.3 g/dL (ref 6.0–8.5)

## 2020-05-04 LAB — CBC WITH DIFFERENTIAL/PLATELET
Basophils Absolute: 0 10*3/uL (ref 0.0–0.2)
Basos: 0 %
EOS (ABSOLUTE): 0 10*3/uL (ref 0.0–0.4)
Eos: 1 %
Hematocrit: 43.9 % (ref 34.0–46.6)
Hemoglobin: 14.9 g/dL (ref 11.1–15.9)
Immature Grans (Abs): 0 10*3/uL (ref 0.0–0.1)
Immature Granulocytes: 1 %
Lymphocytes Absolute: 1.6 10*3/uL (ref 0.7–3.1)
Lymphs: 18 %
MCH: 35.1 pg — ABNORMAL HIGH (ref 26.6–33.0)
MCHC: 33.9 g/dL (ref 31.5–35.7)
MCV: 104 fL — ABNORMAL HIGH (ref 79–97)
Monocytes Absolute: 0.7 10*3/uL (ref 0.1–0.9)
Monocytes: 8 %
Neutrophils Absolute: 6.4 10*3/uL (ref 1.4–7.0)
Neutrophils: 72 %
Platelets: 295 10*3/uL (ref 150–450)
RBC: 4.24 x10E6/uL (ref 3.77–5.28)
RDW: 11.7 % (ref 11.7–15.4)
WBC: 8.8 10*3/uL (ref 3.4–10.8)

## 2020-05-05 ENCOUNTER — Telehealth: Payer: Self-pay | Admitting: Cardiology

## 2020-05-05 NOTE — Telephone Encounter (Signed)
Patient is requesting to go over her lab results.

## 2020-05-06 NOTE — Telephone Encounter (Signed)
Results reviewed with pt as per Dr. Revankar's note.  Pt verbalized understanding and had no additional questions.   

## 2020-05-13 ENCOUNTER — Telehealth: Payer: Self-pay | Admitting: Cardiology

## 2020-05-13 NOTE — Telephone Encounter (Signed)
Patient calling to update States CT was scheduled for 05/26/20

## 2020-05-13 NOTE — Telephone Encounter (Signed)
Patient called Karen Ortega to schedule 2 m fu here  Due to location.    Scheduled in June with Karen Ortega    Patient notes that ct was cancelled due to positive pregnancy but patient believes she had a miscarriage this week and is making appt with womens to have an evaluation.    Karen Ortega -  Patient was told she would need a new order for the ct to be done and set up after she confirms miscarriage and is able to reschedule CT.   Karen Ortega - FYI as patient changing to [facility]

## 2020-05-14 NOTE — Telephone Encounter (Signed)
Noted     Closing encounter

## 2020-05-22 ENCOUNTER — Telehealth (HOSPITAL_COMMUNITY): Payer: Self-pay | Admitting: Emergency Medicine

## 2020-05-22 NOTE — Telephone Encounter (Signed)
Reaching out to patient to offer assistance regarding upcoming cardiac imaging study; pt verbalizes understanding of appt date/time, parking situation and where to check in, pre-test NPO status and medications ordered, and verified current allergies; name and call back number provided for further questions should they arise Rockwell Alexandria RN Navigator Cardiac Imaging [facility] [phone] office [phone] cell  100mg  metoprolol tartrate 2  Hr prior  

## 2020-05-25 ENCOUNTER — Ambulatory Visit (HOSPITAL_COMMUNITY): Payer: BC Managed Care – PPO

## 2020-05-26 ENCOUNTER — Other Ambulatory Visit: Payer: Self-pay

## 2020-05-26 ENCOUNTER — Ambulatory Visit (HOSPITAL_COMMUNITY)
Admission: RE | Admit: 2020-05-26 | Discharge: 2020-05-26 | Disposition: A | Discharge status: Home / Self Care | Payer: BC Managed Care – PPO | Source: Ambulatory Visit | Attending: Cardiology | Admitting: Cardiology

## 2020-05-26 DIAGNOSIS — I209 Angina pectoris, unspecified: Secondary | ICD-10-CM

## 2020-05-26 DIAGNOSIS — I259 Chronic ischemic heart disease, unspecified: Secondary | ICD-10-CM

## 2020-05-26 DIAGNOSIS — F17210 Nicotine dependence, cigarettes, uncomplicated: Secondary | ICD-10-CM | POA: Insufficient documentation

## 2020-05-26 DIAGNOSIS — I700 Atherosclerosis of aorta: Secondary | ICD-10-CM | POA: Insufficient documentation

## 2020-05-26 DIAGNOSIS — Z006 Encounter for examination for normal comparison and control in clinical research program: Secondary | ICD-10-CM

## 2020-05-26 MED ORDER — NITROGLYCERIN 0.4 MG SL SUBL: 0.8000 mg | SUBLINGUAL_TABLET | Freq: Once | SUBLINGUAL | Status: AC

## 2020-05-26 MED ORDER — IOHEXOL 350 MG/ML SOLN
80.0000 mL | Freq: Once | INTRAVENOUS | Status: AC | PRN
  Administered 2020-05-26: 95 mL via INTRAVENOUS

## 2020-05-26 MED ORDER — NITROGLYCERIN 0.4 MG SL SUBL
SUBLINGUAL_TABLET | SUBLINGUAL | Status: AC
  Administered 2020-05-26: 0.8 mg via SUBLINGUAL
  Filled 2020-05-26: qty 2

## 2020-05-26 NOTE — Research (Signed)
IDENTIFY Informed Consent                  Subject Name: [facility]   Subject met inclusion and exclusion criteria.  The informed consent form, study requirements and expectations were reviewed with the subject and questions and concerns were addressed prior to the signing of the consent form.  The subject verbalized understanding of the trial requirements.  The subject agreed to participate in the IDENTIFY trial and signed the informed consent at  08:31AM on 05/26/20.  The informed consent was obtained prior to performance of any protocol-specific procedures for the subject.  A copy of the signed informed consent was given to the subject and a copy was placed in the subject's medical record.   Sallye Ober , Research Assistant

## 2020-05-27 ENCOUNTER — Telehealth: Payer: Self-pay | Admitting: Cardiology

## 2020-05-27 NOTE — Telephone Encounter (Signed)
Patient calling in stating she would like to discuss CT results. Patient made aware that [place] would have to discuss these results until seen in the [facility] office  Please call and advise patient

## 2020-05-27 NOTE — Telephone Encounter (Signed)
Pt advised that her result has not been resulted by MD. Pt advised we will call her back with the result note.

## 2020-06-01 ENCOUNTER — Ambulatory Visit: Payer: BC Managed Care – PPO | Admitting: Obstetrics and Gynecology

## 2020-06-01 ENCOUNTER — Telehealth: Payer: Self-pay

## 2020-06-01 ENCOUNTER — Other Ambulatory Visit (HOSPITAL_COMMUNITY)
Admission: RE | Admit: 2020-06-01 | Discharge: 2020-06-01 | Disposition: A | Discharge status: Home / Self Care | Payer: BC Managed Care – PPO | Source: Ambulatory Visit | Attending: Obstetrics and Gynecology | Admitting: Obstetrics and Gynecology

## 2020-06-01 ENCOUNTER — Other Ambulatory Visit: Payer: Self-pay

## 2020-06-01 ENCOUNTER — Encounter: Payer: Self-pay | Admitting: Obstetrics and Gynecology

## 2020-06-01 ENCOUNTER — Ambulatory Visit
Admission: RE | Admit: 2020-06-01 | Discharge: 2020-06-01 | Disposition: A | Discharge status: Home / Self Care | Payer: BC Managed Care – PPO | Source: Ambulatory Visit | Attending: Obstetrics and Gynecology | Admitting: Obstetrics and Gynecology

## 2020-06-01 VITALS — BP 124/78 | Ht 66.0 in | Wt 122.0 lb

## 2020-06-01 DIAGNOSIS — Z124 Encounter for screening for malignant neoplasm of cervix: Secondary | ICD-10-CM | POA: Insufficient documentation

## 2020-06-01 DIAGNOSIS — N926 Irregular menstruation, unspecified: Secondary | ICD-10-CM

## 2020-06-01 DIAGNOSIS — B9689 Other specified bacterial agents as the cause of diseases classified elsewhere: Secondary | ICD-10-CM

## 2020-06-01 DIAGNOSIS — Z3009 Encounter for other general counseling and advice on contraception: Secondary | ICD-10-CM

## 2020-06-01 DIAGNOSIS — N760 Acute vaginitis: Secondary | ICD-10-CM

## 2020-06-01 DIAGNOSIS — O034 Incomplete spontaneous abortion without complication: Secondary | ICD-10-CM

## 2020-06-01 DIAGNOSIS — Z113 Encounter for screening for infections with a predominantly sexual mode of transmission: Secondary | ICD-10-CM | POA: Diagnosis present

## 2020-06-01 DIAGNOSIS — R112 Nausea with vomiting, unspecified: Secondary | ICD-10-CM

## 2020-06-01 LAB — POCT URINE PREGNANCY: Preg Test, Ur: NEGATIVE

## 2020-06-01 MED ORDER — MEDROXYPROGESTERONE ACETATE 150 MG/ML IM SUSP: 150.0000 mg | INTRAMUSCULAR | 4 refills | Status: DC

## 2020-06-01 MED ORDER — METRONIDAZOLE 0.75 % VA GEL: 1.0000 | Freq: Every day | VAGINAL | 1 refills | Status: DC

## 2020-06-01 NOTE — Telephone Encounter (Signed)
Called patient to schedule suction d&c w [facility]  DOS 5/5  H&P N/A   Covid testing 5/3 @ 8:00am, Medical [address], [place] [address]. Advised pt to wear mask until DOS.  Pre-admit phone call appointment to be requested. Also all appointments will be updated on pt MyChart. Explained that this appointment has a call window. Based on the time scheduled will indicate if the call will be received within a 4 hour window before 1:00 or after.  Advised that pt may also receive calls from the hospital pharmacy and pre-service center.  Confirmed pt has [employer] as primary insurance. No secondary insurance.

## 2020-06-01 NOTE — Patient Instructions (Signed)
Atlas of pelvic anatomy and gynecologic surgery (4th ed., pp. 205-212). [place], [place]: Elsevier.">  Dilation and Curettage or Vacuum Curettage Dilation and curettage (D&C) and vacuum curettage are minor procedures. A D&C involves stretching the cervix (dilation) and scraping the inside lining of the uterus with surgical instruments (curettage). During a D&C, tissue is gently scraped from the lining of the uterus (endometrium), starting from the top portion of the uterus down to the lowest part of the uterus. During a vacuum curettage, the lining and tissue in the uterus are removed with the use of gentle suction. Curettage may be performed to either diagnose or treat a problem. For diagnosis A diagnostic curettage may be done if you have:  Irregular bleeding in the uterus.  Bleeding with the development of clots.  Spotting between menstrual periods.  Prolonged menstrual periods or other abnormal bleeding.  Bleeding after menopause.  No menstrual period (amenorrhea).  A change in size and shape of the uterus.  Abnormal endometrial cells discovered during a Pap test. For treatment Curettage may be done:  To remove an IUD (intrauterine device).  To remove remaining placenta after giving birth.  During an abortion.  During a miscarriage.  To remove growths in the lining of the uterus.  To remove some rare types of non-cancerous lumps (fibroids). Tell a health care provider about:  Any allergies you have, including allergies to prescribed medicine or latex.  All medicines you are taking, including vitamins, herbs, eye drops, creams, and over-the-counter medicines.  Any blood-thinning medicine you may be taking.  Any problems you or family members have had with anesthetic medicines.  Any blood disorders you have.  Any surgeries you have had.  Your medical history and any medical conditions you have.  Whether you are pregnant or may be pregnant.  Recent vaginal  infections you have had.  Recent menstrual periods, bleeding problems you have had, and what form of birth control (contraception) you use. What are the risks? Generally, this is a safe procedure. However, problems may occur, including:  Infection.  Heavy vaginal bleeding.  Allergic reactions to medicines.  Damage to the cervix or other structures or organs.  Development of scar tissue (adhesions) inside the uterus. This can cause abnormal periods and may make it harder to get pregnant.  A hole (perforation) in the wall of the uterus. This is rare. What happens before the procedure? Staying hydrated Follow instructions from your health care provider about hydration, which may include:  Up to 2 hours before the procedure - you may continue to drink clear liquids, such as water, clear fruit juice, black coffee, and plain tea.   Eating and drinking restrictions Follow instructions from your health care provider about eating and drinking, which may include:  8 hours before the procedure - stop eating heavy meals or foods, such as meat, fried foods, or fatty foods.  6 hours before the procedure - stop eating light meals or foods, such as toast or cereal.  6 hours before the procedure - stop drinking milk or drinks that contain milk.  2 hours before the procedure - stop drinking clear liquids. If your health care provider told you to take your medicine(s) on the day of your procedure, take them with only a sip of water. Medicines  Ask your health care provider about: ? Changing or stopping your regular medicines. This is especially important if you are taking diabetes medicines or blood thinners. ? Taking medicines such as aspirin and ibuprofen. These medicines can  thin your blood. Do not take these medicines unless your health care provider tells you to take them. ? Taking over-the-counter medicines, vitamins, herbs, and supplements.  You may be given a medicine to soften the cervix  in order to help with dilation. Surgery safety Ask your health care provider what steps will be taken to help prevent infection. These may include:  Removing hair at the surgery site.  Washing skin with a germ-killing soap.  Taking antibiotic medicine. General instructions  Do not use any products that contain nicotine or tobacco for at least 4 weeks before the procedure. These products include cigarettes, e-cigarettes, and chewing tobacco. If you need help quitting, ask your health care provider.  For 24 hours before your procedure, do not: ? Douche. ? Use tampons. ? Use medicines, creams, or suppositories in the vagina. ? Have sex.  You may be given a pregnancy test on the day of the procedure.  You may have a blood or urine sample taken.  Plan to have someone take you home from the hospital or clinic.  If you will be going home right after the procedure, plan to have someone with you for 24 hours. What happens during the procedure?  An IV will be inserted into one of your veins.  You will be given one of the following: ? A medicine that numbs the area in and around the cervix (local anesthetic). ? A medicine to make you fall asleep (general anesthetic).  You will lie down on your back, with your feet in foot rests (stirrups).  The size and position of your uterus will be checked.  A lubricated instrument (speculum or Sims retractor) will be inserted into the back side of your vagina. The speculum will be used to hold apart the walls of your vagina so your health care provider can see your cervix.  A tool (tenaculum) will be attached to the lip of the cervix to stabilize it.  Your cervix will be softened and dilated. This may be done by: ? Taking medicine, either orally or vaginally. ? Having thin rods (laminaria) or gradual widening instruments (tapered dilators) inserted into your cervix.  A small, sharp, curved instrument (curette) will be used to scrape a small  amount of tissue or cells from the endometrium or cervical canal. In some cases, gentle suction is applied with the curette.  The curette will then be removed.  The cells will be taken to a lab for testing. The procedure may vary among health care providers and hospitals.   What happens after the procedure?  Your blood pressure, heart rate, breathing rate, and blood oxygen level will be monitored until you leave the hospital or clinic.  You may have mild cramping, backache, pain, and light bleeding or spotting. You may pass small blood clots from your vagina.  You may have to wear compression stockings. These stockings help to prevent blood clots and reduce swelling in your legs. Summary  Dilation and curettage (D&C) involves stretching (dilating) the cervix and scraping the inside lining of the uterus (curettage).  Follow your health care provider's instructions about when to stop eating and drinking, and whether to stop or change any medicines.  After the procedure, you may have mild cramping, backache, pain, and light bleeding or spotting. You may pass small blood clots from your vagina.  Plan to have someone take you home from the hospital or clinic. This information is not intended to replace advice given to you by your health care  provider. Make sure you discuss any questions you have with your health care provider. Document Revised: 02/19/2019 Document Reviewed: 02/19/2019 Elsevier Patient Education  2021 [employer].

## 2020-06-01 NOTE — Telephone Encounter (Signed)
Orders placed.

## 2020-06-01 NOTE — H&P (View-Only) (Signed)
 Patient ID: Karen Ortega, female   DOB: 01/23/1981, 39 y.o.   MRN: [id number]  Reason for Consult: Gynecologic Exam (Thinks she has miscarried, had gushing blood come out. RM 1)   Referred by [place], Joanna R, NP  Subjective:     HPI:  Karen Ortega is a 39 y.o. female.  She presents today for a consultation regarding possible early pregnancy.  She was at [employer] and had a gush of blood that bypassed her tampon.  She was seen by her primary care doctor and had a positive urine pregnancy test.  A pelvic ultrasound showed a 6-week gestation with no fetal heart tones.  This ultrasound was performed on April 7. She has a copy of this ultrasound report on her phone. After that visit she had even heavier bleeding that she assumed was a miscarriage. She has had minimal spotting since that time. About 1 week ago the bleeding returned but it is heavier than her usual period.   Gynecological History  No LMP recorded (lmp unknown). Menarche: 13  She denies passage of large clots She denies sensations of gushing or flooding of blood. She denies accidents where she bleeds through her clothing. She denies that she changes a saturated pad or tampon more frequently than every hour.  She denies that pain from her periods limits her activities.  History of fibroids, polyps, or ovarian cysts? : no  History of PCOS? no Hstory of Endometriosis? no History of abnormal pap smears? no Have you had any sexually transmitted infections in the past? Yes in the remote past, highschool  Last Pap: Results were: unknown  She identifies as a female. She is sexually active with men.   She denies dyspareunia. She denies postcoital bleeding.  She currently uses none for contraception.   Obstetrical History [id number]  Past Medical History:  Diagnosis Date  . Anxiety disorder   . Atherosclerosis of aorta (HCC)   . Chest pain, unspecified   . Gastro-esophageal reflux disease without esophagitis   . Hypertension    . Vitamin D deficiency    Family History  Problem Relation Age of Onset  . Arthritis Mother   . Depression Mother   . Heart disease Mother   . Arthritis Father   . Depression Father   . Heart disease Father   . Breast cancer Cousin 40   Past Surgical History:  Procedure Laterality Date  . NO PAST SURGERIES      Short Social History:  Social History   Tobacco Use  . Smoking status: Current Every Day Smoker    Packs/day: 0.50    Types: Cigarettes  . Smokeless tobacco: Never Used  Substance Use Topics  . Alcohol use: Yes    Comment: 5 per day    Allergies  Allergen Reactions  . Amoxicillin   . Bee Venom Hives and Swelling  . Bee Venom   . Other Itching    Darvocet   . Other     DARVOCET    Current Outpatient Medications  Medication Sig Dispense Refill  . aspirin EC 81 MG tablet Take 1 tablet (81 mg total) by mouth daily. Swallow whole. 90 tablet 3  . Cholecalciferol (VITAMIN D-3) 125 MCG (5000 UT) TABS Take 1 tablet by mouth every other day.    . benzonatate (TESSALON PERLES) 100 MG capsule Take 1 capsule (100 mg total) by mouth 3 (three) times daily as needed for cough. (Patient not taking: Reported on 06/01/2020) 20 capsule 0  . dimenhyDRINATE (DRAMAMINE   PO) Take 1 tablet by mouth as needed (nausea and vomiting).    Marland Kitchen esomeprazole (NEXIUM) 20 MG capsule Take 20 mg by mouth 2 (two) times daily before a meal.    . fluticasone (FLONASE) 50 MCG/ACT nasal spray Place 2 sprays into both nostrils daily. (Patient not taking: Reported on 06/01/2020) 16 g 6  . metoprolol tartrate (LOPRESSOR) 100 MG tablet Take 1 tablet (100 mg total) by mouth once for 1 dose. Take 2 hours prior to your CT if your heart rate is greater than 55 1 tablet 0  . nitroGLYCERIN (NITROSTAT) 0.4 MG SL tablet Place 1 tablet (0.4 mg total) under the tongue every 5 (five) minutes as needed. (Patient not taking: Reported on 06/01/2020) 25 tablet 6  . OVER THE COUNTER MEDICATION Take 1 tablet by mouth 2 (two)  times daily. Anxiety and Stress Relief    . pantoprazole (PROTONIX) 40 MG tablet Take 1 tablet (40 mg total) by mouth daily for 14 days. 14 tablet 0  . sucralfate (CARAFATE) 1 g tablet Take 1 tablet (1 g total) by mouth 4 (four) times daily -  with meals and at bedtime for 5 days. 20 tablet 0   No current facility-administered medications for this visit.    Review of Systems  Constitutional: Negative for chills, fatigue, fever and unexpected weight change.  HENT: Negative for trouble swallowing.  Eyes: Negative for loss of vision.  Respiratory: Negative for cough, shortness of breath and wheezing.  Cardiovascular: Negative for chest pain, leg swelling, palpitations and syncope.  GI: Negative for abdominal pain, blood in stool, diarrhea, nausea and vomiting.  GU: Negative for difficulty urinating, dysuria, frequency and hematuria.  Musculoskeletal: Negative for back pain, leg pain and joint pain.  Skin: Negative for rash.  Neurological: Negative for dizziness, headaches, light-headedness, numbness and seizures.  Psychiatric: Negative for behavioral problem, confusion, depressed mood and sleep disturbance.        Objective:  Objective   Vitals:   06/01/20 0815  BP: 124/78  Weight: 122 lb (55.3 kg)  Height: 5\' 6"  (1.676 m)   Body mass index is 19.69 kg/m.  Physical Exam Vitals and nursing note reviewed. Exam conducted with a chaperone present.  Constitutional:      Appearance: Normal appearance. She is well-developed.  HENT:     Head: Normocephalic and atraumatic.  Eyes:     Extraocular Movements: Extraocular movements intact.     Pupils: Pupils are equal, round, and reactive to light.  Cardiovascular:     Rate and Rhythm: Normal rate and regular rhythm.  Pulmonary:     Effort: Pulmonary effort is normal. No respiratory distress.     Breath sounds: Normal breath sounds.  Abdominal:     General: Abdomen is flat.     Palpations: Abdomen is soft.  Genitourinary:     Comments: External: Normal appearing vulva. No lesions noted.  Speculum examination: Normal appearing cervix. Small blood in the vaginal vault. No discharge.   Bimanual examination: Uterus midline, non-tender, normal in size, shape and contour.  No CMT. No adnexal masses. No adnexal tenderness. Pelvis not fixed.  Musculoskeletal:        General: No signs of injury.  Skin:    General: Skin is warm and dry.  Neurological:     Mental Status: She is alert and oriented to person, place, and time.  Psychiatric:        Behavior: Behavior normal.        Thought Content: Thought content  normal.        Judgment: Judgment normal.     Assessment/Plan:     40 yo  1. Desires contraceptive therapy- counseled regarding options, desires to start depo provera. 2. Cervical cancer screening- pap smear today. 3. Missed abortion- bedside transvaginal US shows an empty gestational sac, will obtain US through the hospital. Briefly discussed options for cytotec versus D&C.   4. Nausea and vomiting- amb referral to GI 5. Bacterial vaginosis- failed oral medication before. Will treat with metrogel  Follow up: [place] at hospital consistent with missed abortion, discussed with patient over the phone. She would like to proceed with a D&C. Note sent to scheduler.     Adelene Idler MD [facility], [facility], [facility] 06/01/2020 8:22 AM

## 2020-06-01 NOTE — Progress Notes (Signed)
Patient ID: Karen Ortega, female   DOB: 24-Aug-1980, 40 y.o.   MRN: [id number]  Reason for Consult: Gynecologic Exam (Thinks she has miscarried, had gushing blood come out. RM 1)   Referred by Eber Jones, NP  Subjective:     HPI:  Karen Ortega is a 40 y.o. female.  She presents today for a consultation regarding possible early pregnancy.  She was at [facility] and had a gush of blood that bypassed her tampon.  She was seen by her primary care doctor and had a positive urine pregnancy test.  A pelvic ultrasound showed a 6-week gestation with no fetal heart tones.  This ultrasound was performed on April 7. She has a copy of this ultrasound report on her phone. After that visit she had even heavier bleeding that she assumed was a miscarriage. She has had minimal spotting since that time. About 1 week ago the bleeding returned but it is heavier than her usual period.   Gynecological History  No LMP recorded (lmp unknown). Menarche: 13  She denies passage of large clots She denies sensations of gushing or flooding of blood. She denies accidents where she bleeds through her clothing. She denies that she changes a saturated pad or tampon more frequently than every hour.  She denies that pain from her periods limits her activities.  History of fibroids, polyps, or ovarian cysts? : no  History of PCOS? no Hstory of Endometriosis? no History of abnormal pap smears? no Have you had any sexually transmitted infections in the past? Yes in the remote past, highschool  Last Pap: Results were: unknown  She identifies as a female. She is sexually active with men.   She denies dyspareunia. She denies postcoital bleeding.  She currently uses none for contraception.   Obstetrical History [id number]  Past Medical History:  Diagnosis Date  . Anxiety disorder   . Atherosclerosis of aorta (HCC)   . Chest pain, unspecified   . Gastro-esophageal reflux disease without esophagitis   . Hypertension    . Vitamin D deficiency    Family History  Problem Relation Age of Onset  . Arthritis Mother   . Depression Mother   . Heart disease Mother   . Arthritis Father   . Depression Father   . Heart disease Father   . Breast cancer Cousin 40   Past Surgical History:  Procedure Laterality Date  . NO PAST SURGERIES      Short Social History:  Social History   Tobacco Use  . Smoking status: Current Every Day Smoker    Packs/day: 0.50    Types: Cigarettes  . Smokeless tobacco: Never Used  Substance Use Topics  . Alcohol use: Yes    Comment: 5 per day    Allergies  Allergen Reactions  . Amoxicillin   . Bee Venom Hives and Swelling  . Bee Venom   . Other Itching    Darvocet   . Other     DARVOCET    Current Outpatient Medications  Medication Sig Dispense Refill  . aspirin EC 81 MG tablet Take 1 tablet (81 mg total) by mouth daily. Swallow whole. 90 tablet 3  . Cholecalciferol (VITAMIN D-3) 125 MCG (5000 UT) TABS Take 1 tablet by mouth every other day.    . benzonatate (TESSALON PERLES) 100 MG capsule Take 1 capsule (100 mg total) by mouth 3 (three) times daily as needed for cough. (Patient not taking: Reported on 06/01/2020) 20 capsule 0  . dimenhyDRINATE (DRAMAMINE  PO) Take 1 tablet by mouth as needed (nausea and vomiting).    Marland Kitchen esomeprazole (NEXIUM) 20 MG capsule Take 20 mg by mouth 2 (two) times daily before a meal.    . fluticasone (FLONASE) 50 MCG/ACT nasal spray Place 2 sprays into both nostrils daily. (Patient not taking: Reported on 06/01/2020) 16 g 6  . metoprolol tartrate (LOPRESSOR) 100 MG tablet Take 1 tablet (100 mg total) by mouth once for 1 dose. Take 2 hours prior to your CT if your heart rate is greater than 55 1 tablet 0  . nitroGLYCERIN (NITROSTAT) 0.4 MG SL tablet Place 1 tablet (0.4 mg total) under the tongue every 5 (five) minutes as needed. (Patient not taking: Reported on 06/01/2020) 25 tablet 6  . OVER THE COUNTER MEDICATION Take 1 tablet by mouth 2 (two)  times daily. Anxiety and Stress Relief    . pantoprazole (PROTONIX) 40 MG tablet Take 1 tablet (40 mg total) by mouth daily for 14 days. 14 tablet 0  . sucralfate (CARAFATE) 1 g tablet Take 1 tablet (1 g total) by mouth 4 (four) times daily -  with meals and at bedtime for 5 days. 20 tablet 0   No current facility-administered medications for this visit.    Review of Systems  Constitutional: Negative for chills, fatigue, fever and unexpected weight change.  HENT: Negative for trouble swallowing.  Eyes: Negative for loss of vision.  Respiratory: Negative for cough, shortness of breath and wheezing.  Cardiovascular: Negative for chest pain, leg swelling, palpitations and syncope.  GI: Negative for abdominal pain, blood in stool, diarrhea, nausea and vomiting.  GU: Negative for difficulty urinating, dysuria, frequency and hematuria.  Musculoskeletal: Negative for back pain, leg pain and joint pain.  Skin: Negative for rash.  Neurological: Negative for dizziness, headaches, light-headedness, numbness and seizures.  Psychiatric: Negative for behavioral problem, confusion, depressed mood and sleep disturbance.        Objective:  Objective   Vitals:   06/01/20 0815  BP: 124/78  Weight: 122 lb (55.3 kg)  Height: 5\' 6"  (1.676 m)   Body mass index is 19.69 kg/m.  Physical Exam Vitals and nursing note reviewed. Exam conducted with a chaperone present.  Constitutional:      Appearance: Normal appearance. She is well-developed.  HENT:     Head: Normocephalic and atraumatic.  Eyes:     Extraocular Movements: Extraocular movements intact.     Pupils: Pupils are equal, round, and reactive to light.  Cardiovascular:     Rate and Rhythm: Normal rate and regular rhythm.  Pulmonary:     Effort: Pulmonary effort is normal. No respiratory distress.     Breath sounds: Normal breath sounds.  Abdominal:     General: Abdomen is flat.     Palpations: Abdomen is soft.  Genitourinary:     Comments: External: Normal appearing vulva. No lesions noted.  Speculum examination: Normal appearing cervix. Small blood in the vaginal vault. No discharge.   Bimanual examination: Uterus midline, non-tender, normal in size, shape and contour.  No CMT. No adnexal masses. No adnexal tenderness. Pelvis not fixed.  Musculoskeletal:        General: No signs of injury.  Skin:    General: Skin is warm and dry.  Neurological:     Mental Status: She is alert and oriented to person, place, and time.  Psychiatric:        Behavior: Behavior normal.        Thought Content: Thought content  normal.        Judgment: Judgment normal.     Assessment/Plan:     40 yo  1. Desires contraceptive therapy- counseled regarding options, desires to start depo provera. 2. Cervical cancer screening- pap smear today. 3. Missed abortion- bedside transvaginal US shows an empty gestational sac, will obtain US through the hospital. Briefly discussed options for cytotec versus D&C.   4. Nausea and vomiting- amb referral to GI 5. Bacterial vaginosis- failed oral medication before. Will treat with metrogel  Follow up: [place] at hospital consistent with missed abortion, discussed with patient over the phone. She would like to proceed with a D&C. Note sent to scheduler.     Adelene Idler MD [facility], [facility] 06/01/2020 8:22 AM

## 2020-06-01 NOTE — Telephone Encounter (Signed)
-----   Message from Natale Milch, MD sent at 06/01/2020 11:46 AM EDT ----- Surgery Booking Request Patient Full Name:  Karen Ortega  MRN: [id number]  DOB: 09-02-1980  Surgeon: Natale Milch, MD  Requested Surgery Date and Time: 06/04/2020 Primary Diagnosis AND Code: Missed abortion O02.1 Secondary Diagnosis and Code:  Surgical Procedure: Suction D&C RNFA Requested?: No L&D Notification: No Admission Status: same day surgery Length of Surgery: 50 min Special Case Needs: No H&P: No Phone Interview???:  Yes Interpreter: No Medical Clearance:  No Special Scheduling Instructions: No Any known health/anesthesia issues, diabetes, sleep apnea, latex allergy, defibrillator/pacemaker?: No Acuity: P2   (P1 highest, P2 delay may cause harm, P3 low, elective gyn, P4 lowest)

## 2020-06-02 ENCOUNTER — Encounter: Payer: Self-pay | Admitting: *Deleted

## 2020-06-02 ENCOUNTER — Other Ambulatory Visit
Admission: RE | Admit: 2020-06-02 | Discharge: 2020-06-02 | Disposition: A | Discharge status: Home / Self Care | Payer: BC Managed Care – PPO | Source: Ambulatory Visit | Attending: Obstetrics and Gynecology | Admitting: Obstetrics and Gynecology

## 2020-06-02 DIAGNOSIS — O99331 Smoking (tobacco) complicating pregnancy, first trimester: Secondary | ICD-10-CM | POA: Diagnosis not present

## 2020-06-02 DIAGNOSIS — Z885 Allergy status to narcotic agent status: Secondary | ICD-10-CM | POA: Diagnosis not present

## 2020-06-02 DIAGNOSIS — Z79899 Other long term (current) drug therapy: Secondary | ICD-10-CM | POA: Diagnosis not present

## 2020-06-02 DIAGNOSIS — Z3A01 Less than 8 weeks gestation of pregnancy: Secondary | ICD-10-CM | POA: Diagnosis not present

## 2020-06-02 DIAGNOSIS — Z20822 Contact with and (suspected) exposure to covid-19: Secondary | ICD-10-CM | POA: Insufficient documentation

## 2020-06-02 DIAGNOSIS — Z01812 Encounter for preprocedural laboratory examination: Secondary | ICD-10-CM | POA: Insufficient documentation

## 2020-06-02 DIAGNOSIS — O021 Missed abortion: Secondary | ICD-10-CM | POA: Diagnosis not present

## 2020-06-02 DIAGNOSIS — F17210 Nicotine dependence, cigarettes, uncomplicated: Secondary | ICD-10-CM | POA: Diagnosis not present

## 2020-06-02 DIAGNOSIS — Z7982 Long term (current) use of aspirin: Secondary | ICD-10-CM | POA: Diagnosis not present

## 2020-06-02 DIAGNOSIS — Z880 Allergy status to penicillin: Secondary | ICD-10-CM | POA: Diagnosis not present

## 2020-06-02 LAB — SARS CORONAVIRUS 2 (TAT 6-24 HRS): SARS Coronavirus 2: NEGATIVE

## 2020-06-02 LAB — BETA HCG QUANT (REF LAB): hCG Quant: 56 m[IU]/mL

## 2020-06-04 ENCOUNTER — Encounter: Payer: Self-pay | Admitting: Obstetrics and Gynecology

## 2020-06-04 ENCOUNTER — Encounter
Admission: RE | Disposition: A | Discharge status: Home / Self Care | Payer: Self-pay | Source: Ambulatory Visit | Attending: Obstetrics and Gynecology

## 2020-06-04 ENCOUNTER — Other Ambulatory Visit: Payer: Self-pay

## 2020-06-04 ENCOUNTER — Ambulatory Visit
Admission: RE | Admit: 2020-06-04 | Discharge: 2020-06-04 | Disposition: A | Discharge status: Home / Self Care | Payer: BC Managed Care – PPO | Source: Ambulatory Visit | Attending: Obstetrics and Gynecology | Admitting: Obstetrics and Gynecology

## 2020-06-04 ENCOUNTER — Ambulatory Visit: Payer: BC Managed Care – PPO | Admitting: Anesthesiology

## 2020-06-04 DIAGNOSIS — Z7982 Long term (current) use of aspirin: Secondary | ICD-10-CM | POA: Insufficient documentation

## 2020-06-04 DIAGNOSIS — F17210 Nicotine dependence, cigarettes, uncomplicated: Secondary | ICD-10-CM | POA: Insufficient documentation

## 2020-06-04 DIAGNOSIS — O021 Missed abortion: Secondary | ICD-10-CM | POA: Insufficient documentation

## 2020-06-04 DIAGNOSIS — Z880 Allergy status to penicillin: Secondary | ICD-10-CM | POA: Insufficient documentation

## 2020-06-04 DIAGNOSIS — Z3A01 Less than 8 weeks gestation of pregnancy: Secondary | ICD-10-CM | POA: Insufficient documentation

## 2020-06-04 DIAGNOSIS — Z20822 Contact with and (suspected) exposure to covid-19: Secondary | ICD-10-CM | POA: Insufficient documentation

## 2020-06-04 DIAGNOSIS — Z885 Allergy status to narcotic agent status: Secondary | ICD-10-CM | POA: Insufficient documentation

## 2020-06-04 DIAGNOSIS — O99331 Smoking (tobacco) complicating pregnancy, first trimester: Secondary | ICD-10-CM | POA: Insufficient documentation

## 2020-06-04 DIAGNOSIS — Z79899 Other long term (current) drug therapy: Secondary | ICD-10-CM | POA: Insufficient documentation

## 2020-06-04 HISTORY — PX: DILATION AND EVACUATION: SHX1459

## 2020-06-04 LAB — CBC
HCT: 39.2 % (ref 36.0–46.0)
Hemoglobin: 14.5 g/dL (ref 12.0–15.0)
MCH: 36.9 pg — ABNORMAL HIGH (ref 26.0–34.0)
MCHC: 37 g/dL — ABNORMAL HIGH (ref 30.0–36.0)
MCV: 99.7 fL (ref 80.0–100.0)
Platelets: 289 10*3/uL (ref 150–400)
RBC: 3.93 MIL/uL (ref 3.87–5.11)
RDW: 11.9 % (ref 11.5–15.5)
WBC: 7.3 10*3/uL (ref 4.0–10.5)
nRBC: 0 % (ref 0.0–0.2)

## 2020-06-04 LAB — TYPE AND SCREEN
ABO/RH(D): O POS
Antibody Screen: NEGATIVE

## 2020-06-04 LAB — POCT PREGNANCY, URINE: Preg Test, Ur: NEGATIVE

## 2020-06-04 SURGERY — DILATION AND EVACUATION, UTERUS
Anesthesia: General | Site: Vagina

## 2020-06-04 MED ORDER — DROPERIDOL 2.5 MG/ML IJ SOLN
0.6250 mg | Freq: Once | INTRAMUSCULAR | Status: DC | PRN
  Filled 2020-06-04: qty 0.25

## 2020-06-04 MED ORDER — GLYCOPYRROLATE 0.2 MG/ML IJ SOLN
INTRAMUSCULAR | Status: DC | PRN
  Administered 2020-06-04: .2 mg via INTRAVENOUS

## 2020-06-04 MED ORDER — DEXAMETHASONE SODIUM PHOSPHATE 10 MG/ML IJ SOLN
INTRAMUSCULAR | Status: DC | PRN
  Administered 2020-06-04: 10 mg via INTRAVENOUS

## 2020-06-04 MED ORDER — PHENYLEPHRINE HCL (PRESSORS) 10 MG/ML IV SOLN
INTRAVENOUS | Status: DC | PRN
  Administered 2020-06-04: 50 ug via INTRAVENOUS
  Administered 2020-06-04: 100 ug via INTRAVENOUS
  Administered 2020-06-04: 50 ug via INTRAVENOUS

## 2020-06-04 MED ORDER — ACETAMINOPHEN 160 MG/5ML PO SOLN
325.0000 mg | ORAL | Status: DC | PRN
  Filled 2020-06-04: qty 20.3

## 2020-06-04 MED ORDER — PROMETHAZINE HCL 25 MG/ML IJ SOLN: 6.2500 mg | INTRAMUSCULAR | Status: DC | PRN

## 2020-06-04 MED ORDER — POVIDONE-IODINE 10 % EX SWAB: 2.0000 "application " | Freq: Once | CUTANEOUS | Status: DC

## 2020-06-04 MED ORDER — FENTANYL CITRATE (PF) 100 MCG/2ML IJ SOLN: 25.0000 ug | INTRAMUSCULAR | Status: DC | PRN

## 2020-06-04 MED ORDER — MIDAZOLAM HCL 2 MG/2ML IJ SOLN
INTRAMUSCULAR | Status: AC
  Filled 2020-06-04: qty 2

## 2020-06-04 MED ORDER — ACETAMINOPHEN 325 MG PO TABS
325.0000 mg | ORAL_TABLET | ORAL | Status: DC | PRN
Start: 2020-06-04 — End: 2020-06-04

## 2020-06-04 MED ORDER — TRANEXAMIC ACID 1000 MG/10ML IV SOLN
INTRAVENOUS | Status: AC
  Filled 2020-06-04: qty 10

## 2020-06-04 MED ORDER — PROPOFOL 10 MG/ML IV BOLUS
INTRAVENOUS | Status: DC | PRN
  Administered 2020-06-04: 200 mg via INTRAVENOUS

## 2020-06-04 MED ORDER — HYDROCODONE-ACETAMINOPHEN 7.5-325 MG PO TABS: 1.0000 | ORAL_TABLET | Freq: Once | ORAL | Status: DC | PRN

## 2020-06-04 MED ORDER — PROPOFOL 10 MG/ML IV BOLUS
INTRAVENOUS | Status: AC
  Filled 2020-06-04: qty 20

## 2020-06-04 MED ORDER — MIDAZOLAM HCL 2 MG/2ML IJ SOLN
INTRAMUSCULAR | Status: DC | PRN
  Administered 2020-06-04: 2 mg via INTRAVENOUS

## 2020-06-04 MED ORDER — DOXYCYCLINE HYCLATE 100 MG IV SOLR
200.0000 mg | INTRAVENOUS | Status: AC
  Administered 2020-06-04: 200 mg via INTRAVENOUS
  Filled 2020-06-04: qty 200

## 2020-06-04 MED ORDER — DEXMEDETOMIDINE (PRECEDEX) IN NS 20 MCG/5ML (4 MCG/ML) IV SYRINGE
PREFILLED_SYRINGE | INTRAVENOUS | Status: AC
  Filled 2020-06-04: qty 5

## 2020-06-04 MED ORDER — TRAMADOL HCL 50 MG PO TABS: 50.0000 mg | ORAL_TABLET | Freq: Four times a day (QID) | ORAL | 0 refills | Status: AC | PRN

## 2020-06-04 MED ORDER — IBUPROFEN 600 MG PO TABS: 600.0000 mg | ORAL_TABLET | Freq: Four times a day (QID) | ORAL | 0 refills | Status: DC | PRN

## 2020-06-04 MED ORDER — LACTATED RINGERS IV SOLN: INTRAVENOUS | Status: DC

## 2020-06-04 MED ORDER — LIDOCAINE HCL (CARDIAC) PF 100 MG/5ML IV SOSY
PREFILLED_SYRINGE | INTRAVENOUS | Status: DC | PRN
  Administered 2020-06-04: 60 mg via INTRAVENOUS

## 2020-06-04 MED ORDER — KETOROLAC TROMETHAMINE 30 MG/ML IJ SOLN: 30.0000 mg | Freq: Once | INTRAMUSCULAR | Status: DC | PRN

## 2020-06-04 MED ORDER — ONDANSETRON HCL 4 MG/2ML IJ SOLN
INTRAMUSCULAR | Status: DC | PRN
  Administered 2020-06-04: 4 mg via INTRAVENOUS

## 2020-06-04 MED ORDER — TRANEXAMIC ACID-NACL 1000-0.7 MG/100ML-% IV SOLN
INTRAVENOUS | Status: DC | PRN
  Administered 2020-06-04: 1000 mg via INTRAVENOUS

## 2020-06-04 MED ORDER — FENTANYL CITRATE (PF) 100 MCG/2ML IJ SOLN
INTRAMUSCULAR | Status: DC | PRN
  Administered 2020-06-04 (×2): 50 ug via INTRAVENOUS

## 2020-06-04 MED ORDER — FENTANYL CITRATE (PF) 100 MCG/2ML IJ SOLN
INTRAMUSCULAR | Status: AC
  Filled 2020-06-04: qty 2

## 2020-06-04 SURGICAL SUPPLY — 24 items
BAG COUNTER SPONGE EZ (MISCELLANEOUS) ×1 IMPLANT
BAG SPNG 4X4 CLR HAZ (MISCELLANEOUS)
CATH ROBINSON RED A/P 16FR (CATHETERS) ×1 IMPLANT
FILTER UTR ASPR SPEC (MISCELLANEOUS) ×1 IMPLANT
FLTR UTR ASPR SPEC (MISCELLANEOUS) ×2
GLOVE SURG SYN 6.5 ES PF (GLOVE) ×4 IMPLANT
GLOVE SURG SYN 6.5 PF PI (GLOVE) ×2 IMPLANT
GLOVE SURG UNDER POLY LF SZ6.5 (GLOVE) ×4 IMPLANT
GOWN STRL REUS W/ TWL LRG LVL3 (GOWN DISPOSABLE) ×2 IMPLANT
GOWN STRL REUS W/TWL LRG LVL3 (GOWN DISPOSABLE) ×4
HANDLE YANKAUER SUCT BULB TIP (MISCELLANEOUS) ×1 IMPLANT
KIT BERKELEY 1ST TRIMESTER 3/8 (MISCELLANEOUS) ×2 IMPLANT
KIT TURNOVER CYSTO (KITS) ×2 IMPLANT
MANIFOLD NEPTUNE II (INSTRUMENTS) ×2 IMPLANT
NS IRRIG 500ML POUR BTL (IV SOLUTION) ×2 IMPLANT
PACK DNC HYST (MISCELLANEOUS) ×2 IMPLANT
PAD OB MATERNITY 4.3X12.25 (PERSONAL CARE ITEMS) ×2 IMPLANT
PAD PREP 24X41 OB/GYN DISP (PERSONAL CARE ITEMS) ×2 IMPLANT
SET BERKELEY SUCTION TUBING (SUCTIONS) ×2 IMPLANT
TOWEL OR 17X26 4PK STRL BLUE (TOWEL DISPOSABLE) ×2 IMPLANT
VACURETTE 10 RIGID CVD (CANNULA) IMPLANT
VACURETTE 12 RIGID CVD (CANNULA) IMPLANT
VACURETTE 8 RIGID CVD (CANNULA) IMPLANT
VACURETTE 8MM F TIP (MISCELLANEOUS) ×2 IMPLANT

## 2020-06-04 NOTE — Discharge Instructions (Signed)
Dilation and Curettage or Vacuum Curettage, Care After This sheet gives you information about how to care for yourself after your procedure. Your doctor may also give you more specific instructions. If you have problems or questions, contact your doctor. What can I expect after the procedure? After the procedure, it is common to have:  Mild pain or cramping.  Some bleeding or spotting from the vagina. These may last for up to 2 weeks. Follow these instructions at home: Medicines  Take over-the-counter and prescription medicines only as told by your doctor. This is very important if you take blood-thinning medicine.  Ask your doctor if the medicine prescribed to you requires you to avoid driving or using machinery. Activity  If you were given a medicine to help you relax (sedative) during your procedure, it can affect you for many hours. Do not drive or use machinery until your doctor says that it is safe.  Rest as told by your doctor.  Do not sit for a long time without moving. Get up to take short walks every 1-2 hours. This is important. Ask for help if you feel weak or unsteady.  Do not lift anything that is heavier than 10 lb (4.5 kg), or the limit that you are told, until your doctor says that it is safe.  Return to your normal activities as told by your doctor. Ask your doctor what activities are safe for you.   Lifestyle For at least 2 weeks, or as long as told by your doctor:  Do not douche.  Do not use tampons.  Do not have sex. General instructions  Wear compression stockings as told by your doctor.  It is up to you to get the results of your procedure. Ask your doctor, or the department that is doing the procedure, when your results will be ready.  Keep all follow-up visits as told by your doctor. This is important. Contact a doctor if:  You have very bad cramps that get worse or do not get better with medicine.  You have very bad pain in your belly  (abdomen).  You cannot drink fluids without vomiting.  You have pain in a different part of your pelvis. The pelvis is the area just above your thighs.  You have fluid from your vagina that smells bad.  You have a rash. Get help right away if:  You are bleeding a lot from your vagina. A lot of bleeding means soaking more than one sanitary pad in 1 hour for 2 hours in a row.  You have a fever that is above 100.4F (38.0C).  Your belly feels very tender or hard.  You have chest pain.  You have trouble breathing.  You feel dizzy.  You feel light-headed.  You pass out (faint).  You have pain in your neck or shoulder area. These symptoms may be an emergency. Do not wait to see if the symptoms will go away. Get medical help right away. Call your local emergency services (911 in the [place] Do not drive yourself to the hospital. Summary  After your procedure, it is common to have pain or cramping. It is also common to have bleeding or spotting from your vagina.  Rest as told. Do not sit for a long time without moving. Get up to take short walks every 1-2 hours.  Do not lift anything that is heavier than 10 lb (4.5 kg), or the limit that you are told.  Contact your doctor if you have fluid from   your vagina that smells bad.  Get help right away if you develop any problems from the procedure. Ask your doctor what problems to watch for. This information is not intended to replace advice given to you by your health care provider. Make sure you discuss any questions you have with your health care provider. Document Revised: 02/19/2019 Document Reviewed: 02/19/2019 Elsevier Patient Education  2021 Elsevier Inc.   AMBULATORY SURGERY  DISCHARGE INSTRUCTIONS   1) The drugs that you were given will stay in your system until tomorrow so for the next 24 hours you should not:  A) Drive an automobile B) Make any legal decisions C) Drink any alcoholic beverage   2) You may resume  regular meals tomorrow.  Today it is better to start with liquids and gradually work up to solid foods.  You may eat anything you prefer, but it is better to start with liquids, then soup and crackers, and gradually work up to solid foods.   3) Please notify your doctor immediately if you have any unusual bleeding, trouble breathing, redness and pain at the surgery site, drainage, fever, or pain not relieved by medication.    4) Additional Instructions:    Please contact your physician with any problems or Same Day Surgery at [phone], Monday through Friday 6 am to 4 pm, or [facility] number at [phone]. 

## 2020-06-04 NOTE — Anesthesia Procedure Notes (Signed)
Procedure Name: LMA Insertion Date/Time: 06/04/2020 10:37 AM Performed by: Joanette Gula, Manasvini Whatley, CRNA Pre-anesthesia Checklist: Patient identified, Emergency Drugs available, Suction available and Patient being monitored Patient Re-evaluated:Patient Re-evaluated prior to induction Oxygen Delivery Method: Circle system utilized Preoxygenation: Pre-oxygenation with 100% oxygen Induction Type: IV induction Ventilation: Mask ventilation without difficulty LMA: LMA inserted LMA Size: 3.0 Number of attempts: 1 Placement Confirmation: positive ETCO2 and breath sounds checked- equal and bilateral Tube secured with: Tape Dental Injury: Teeth and Oropharynx as per pre-operative assessment

## 2020-06-04 NOTE — Transfer of Care (Signed)
Immediate Anesthesia Transfer of Care Note  Patient: Deannie Christiana  Procedure(s) Performed: DILATATION AND EVACUATION (N/A Vagina )  Patient Location: PACU  Anesthesia Type:General  Level of Consciousness: awake, alert  and oriented  Airway & Oxygen Therapy: Patient Spontanous Breathing and Patient connected to face mask oxygen  Post-op Assessment: Report given to RN and Post -op Vital signs reviewed and stable  Post vital signs: Reviewed and stable  Last Vitals:  Vitals Value Taken Time  BP 146/87 06/04/20 1124  Temp    Pulse 91   Resp 17 06/04/20 1126  SpO2 100   Vitals shown include unvalidated device data.  Last Pain:  Vitals:   06/04/20 0857  TempSrc: Temporal  PainSc: 2          Complications: No complications documented.

## 2020-06-04 NOTE — Interval H&P Note (Signed)
History and Physical Interval Note:  06/04/2020 10:14 AM  Karen Ortega  has presented today for surgery, with the diagnosis of Missed abortion O02.1.  The various methods of treatment have been discussed with the patient and family. After consideration of risks, benefits and other options for treatment, the patient has consented to  Procedure(s): DILATATION AND EVACUATION (N/A) as a surgical intervention.  The patient's history has been reviewed, patient examined, no change in status, stable for surgery.  I have reviewed the patient's chart and labs.  Questions were answered to the patient's satisfaction.     Hayley Horn R Nani Ingram

## 2020-06-04 NOTE — Op Note (Signed)
  Operative Note  06/04/2020 11:39 AM  PRE-OP DIAGNOSIS: Missed abortion O02.1   POST-OP DIAGNOSIS: same  SURGEON: Takyla Kuchera MD  ANESTHESIA: Choice   PROCEDURE: Procedure(s): DILATATION AND EVACUATION   ESTIMATED BLOOD LOSS: 200 mL   SPECIMENS: POC   COMPLICATIONS: none  DISPOSITION: PACU - hemodynamically stable.  CONDITION: stable  FINDINGS: Exam under anesthesia revealed a 8 wk size uterus without palpable adnexal masses.   INDICATION FOR PROCEDURE: Missed Abortion  PROCEDURE IN DETAIL: After informed consent was obtained, the patient was taken to the operating room where anesthesia was obtained without difficulty. The patient was positioned in the dorsal lithotomy position with [employer]. Time out was performed and an exam under anesthesia was performed. The vagina, perineum, and lower abdomen were prepped and draped in a normal sterile fashion. The bladder was emptied with an I&O catheter. A speculum was placed into the vagina and the cervix was grasped with a single toothed tenaculum.  The cervix was gently dilated to 20 [place] with  [employer] dilators. The suction was then tested and found to be adequate, and a 8 felxible suction cannula was advanced into the uterine cavity. The suction was activated and the contents of the uterus were aspirated until no further tissue was obtained. The uterus was then curetted to gritty texture throughout.  At the end of the procedure bleeding was noted to be minimal.  All instruments were then removed from the vagina.The patient tolerated the procedure well. All sponge, instrument, and needle counts were correct. The patient was taken to the recovery room in good condition.   Adelene Idler MD [facility], [facility] 06/04/2020 11:39 AM

## 2020-06-04 NOTE — Anesthesia Preprocedure Evaluation (Signed)
Anesthesia Evaluation  Patient identified by MRN, date of birth, ID band Patient awake    Reviewed: Allergy & Precautions, H&P , NPO status , reviewed documented beta blocker date and time   Airway Mallampati: II  TM Distance: <3 FB Neck ROM: full    Dental  (+) Chipped, Caps   Pulmonary Current Smoker and Patient abstained from smoking.,    Pulmonary exam normal        Cardiovascular hypertension, + angina Normal cardiovascular exam     Neuro/Psych PSYCHIATRIC DISORDERS Anxiety    GI/Hepatic GERD  Controlled,  Endo/Other    Renal/GU      Musculoskeletal   Abdominal   Peds  Hematology   Anesthesia Other Findings Past Medical History: No date: Anxiety disorder No date: Atherosclerosis of aorta (HCC) No date: Chest pain, unspecified No date: Gastro-esophageal reflux disease without esophagitis No date: Hypertension No date: Vitamin D deficiency Past Surgical History: No date: NO PAST SURGERIES BMI    Body Mass Index: 19.11 kg/m     Reproductive/Obstetrics                             Anesthesia Physical Anesthesia Plan  ASA: II  Anesthesia Plan: General   Post-op Pain Management:    Induction: Intravenous  PONV Risk Score and Plan: 2 and Ondansetron, Midazolam and Treatment may vary due to age or medical condition  Airway Management Planned: LMA  Additional Equipment:   Intra-op Plan:   Post-operative Plan: Extubation in OR  Informed Consent: I have reviewed the patients History and Physical, chart, labs and discussed the procedure including the risks, benefits and alternatives for the proposed anesthesia with the patient or authorized representative who has indicated his/her understanding and acceptance.     Dental Advisory Given  Plan Discussed with: CRNA  Anesthesia Plan Comments:         Anesthesia Quick Evaluation

## 2020-06-05 ENCOUNTER — Encounter: Payer: Self-pay | Admitting: Obstetrics and Gynecology

## 2020-06-05 LAB — SURGICAL PATHOLOGY

## 2020-06-09 LAB — CYTOLOGY - PAP
Chlamydia: NEGATIVE
Comment: NEGATIVE
Comment: NEGATIVE
Comment: NEGATIVE
Comment: NEGATIVE
Comment: NORMAL
HPV 16: NEGATIVE
HPV 18 / 45: NEGATIVE
High risk HPV: POSITIVE — AB
Neisseria Gonorrhea: NEGATIVE
Trichomonas: NEGATIVE

## 2020-06-10 ENCOUNTER — Telehealth: Payer: Self-pay

## 2020-06-10 NOTE — Telephone Encounter (Signed)
Pt calling; had procedure last Thrus; now has a sore lump at IV site.  Normal?  [phone]  Adv it does happen sometimes; may apply heat or ice or alternate between them.  Pt has appt Fri.

## 2020-06-12 ENCOUNTER — Ambulatory Visit (INDEPENDENT_AMBULATORY_CARE_PROVIDER_SITE_OTHER): Payer: BC Managed Care – PPO | Admitting: Obstetrics and Gynecology

## 2020-06-12 ENCOUNTER — Encounter: Payer: Self-pay | Admitting: Obstetrics and Gynecology

## 2020-06-12 ENCOUNTER — Telehealth: Payer: Self-pay

## 2020-06-12 ENCOUNTER — Other Ambulatory Visit: Payer: Self-pay

## 2020-06-12 VITALS — BP 115/70 | Ht 67.0 in | Wt 123.0 lb

## 2020-06-12 DIAGNOSIS — Z3009 Encounter for other general counseling and advice on contraception: Secondary | ICD-10-CM

## 2020-06-12 DIAGNOSIS — Z9889 Other specified postprocedural states: Secondary | ICD-10-CM

## 2020-06-12 MED ORDER — MEDROXYPROGESTERONE ACETATE 150 MG/ML IM SUSP
150.0000 mg | Freq: Once | INTRAMUSCULAR | Status: AC
  Administered 2020-06-12: 150 mg via INTRAMUSCULAR

## 2020-06-12 NOTE — Telephone Encounter (Signed)
-----   Message from Natale Milch, MD sent at 06/12/2020  9:41 AM EDT ----- Patient needs a colposcopy. She said she would call back this afternoon to schedule. Please call her if she does not call. Thank you.

## 2020-06-12 NOTE — Progress Notes (Signed)
Patient needs a colposcopy. She said she would call back this afternoon to schedule. Please call her if she does not call. Thank you.

## 2020-06-12 NOTE — Progress Notes (Signed)
  Postoperative Follow-up Patient presents post op from D&E  for  Missed abortion , 1 week ago.  Subjective: Patient reports marked improvement in her preop symptoms. Eating a regular diet without difficulty. Pain is controlled without any medications.  Activity: normal activities of daily living. Patient reports additional symptom's since surgery of None.  Objective: BP 115/70   Ht 5\' 7"  (1.702 m)   Wt 123 lb (55.8 kg)   LMP  (LMP Unknown)   BMI 19.26 kg/m  Physical Exam Constitutional:      Appearance: She is well-developed.  Genitourinary:     Genitourinary Comments:    HENT:     Head: Normocephalic and atraumatic.  Neck:     Thyroid: No thyromegaly.  Cardiovascular:     Rate and Rhythm: Normal rate and regular rhythm.     Heart sounds: Normal heart sounds.  Pulmonary:     Effort: Pulmonary effort is normal.     Breath sounds: Normal breath sounds.  Abdominal:     General: Bowel sounds are normal. There is no distension.     Palpations: Abdomen is soft. There is no mass.  Musculoskeletal:     Cervical back: Neck supple.  Neurological:     Mental Status: She is alert and oriented to person, place, and time.  Skin:    General: Skin is warm and dry.  Psychiatric:        Behavior: Behavior normal.        Thought Content: Thought content normal.        Judgment: Judgment normal.  Vitals reviewed.     Assessment: s/p :  D&E stable  Plan: Patient has done well after surgery with no apparent complications.  I have discussed the post-operative course to date, and the expected progress moving forward.  The patient understands what complications to be concerned about.  I will see the patient in routine follow up, or sooner if needed.    Activity plan: No restriction.  Depo Provera given today. Discussed returning for a colposcopy  MD, [facility], [facility] 06/12/2020 9:29 AM

## 2020-06-12 NOTE — Telephone Encounter (Signed)
Called and spoke with patient about scheduling appointment. Patient decline to scheduled at this time. But plans on reviewing her schedule and call us back to scheduled.

## 2020-06-18 NOTE — Anesthesia Postprocedure Evaluation (Signed)
Anesthesia Post Note  Patient: Davon Ribble  Procedure(s) Performed: DILATATION AND EVACUATION (N/A Vagina )  Patient location during evaluation: PACU Anesthesia Type: General Level of consciousness: awake and alert Pain management: pain level controlled Vital Signs Assessment: post-procedure vital signs reviewed and stable Respiratory status: spontaneous breathing, nonlabored ventilation and respiratory function stable Cardiovascular status: blood pressure returned to baseline and stable Postop Assessment: no apparent nausea or vomiting Anesthetic complications: no   No complications documented.   Last Vitals:  Vitals:   06/04/20 1207 06/04/20 1244  BP: (!) 134/93   Pulse: 77 (P) 68  Resp: 16 (P) 16  Temp: (!) 36.1 C   SpO2: 100% (P) 100%    Last Pain:  Vitals:   06/05/20 0834  TempSrc:   PainSc: 0-No pain                 Christia Reading

## 2020-07-20 ENCOUNTER — Ambulatory Visit: Payer: BC Managed Care – PPO | Admitting: Cardiology

## 2020-08-18 ENCOUNTER — Ambulatory Visit: Payer: BC Managed Care – PPO | Admitting: Gastroenterology

## 2020-08-18 ENCOUNTER — Encounter: Payer: Self-pay | Admitting: *Deleted

## 2020-09-02 ENCOUNTER — Telehealth: Payer: Self-pay

## 2020-09-02 NOTE — Telephone Encounter (Signed)
Pt calling; is out of town; depo is due on the 5th; can she tx rx up there, take to walkin or if [facility] can given it?  [phone]  Pt window is not up until the 12th; she can ask [facility] is they are willing to [facility] and if so to ask them to do a transfer.

## 2020-09-04 ENCOUNTER — Ambulatory Visit: Payer: BC Managed Care – PPO

## 2020-10-01 ENCOUNTER — Other Ambulatory Visit: Payer: Self-pay | Admitting: Student

## 2020-10-01 DIAGNOSIS — R1013 Epigastric pain: Secondary | ICD-10-CM

## 2020-10-02 ENCOUNTER — Ambulatory Visit
Admission: RE | Admit: 2020-10-02 | Discharge: 2020-10-02 | Disposition: A | Discharge status: Home / Self Care | Payer: BC Managed Care – PPO | Source: Ambulatory Visit | Attending: Student | Admitting: Student

## 2020-10-02 ENCOUNTER — Other Ambulatory Visit: Payer: Self-pay

## 2020-10-02 DIAGNOSIS — R1013 Epigastric pain: Secondary | ICD-10-CM | POA: Insufficient documentation

## 2021-03-11 ENCOUNTER — Other Ambulatory Visit (HOSPITAL_COMMUNITY): Payer: Self-pay | Admitting: Gastroenterology

## 2021-03-11 ENCOUNTER — Other Ambulatory Visit: Payer: Self-pay | Admitting: Gastroenterology

## 2021-03-11 DIAGNOSIS — R1013 Epigastric pain: Secondary | ICD-10-CM

## 2021-03-25 ENCOUNTER — Ambulatory Visit: Admission: RE | Admit: 2021-03-25 | Payer: Medicaid Other | Source: Ambulatory Visit

## 2021-04-06 ENCOUNTER — Ambulatory Visit: Admission: RE | Admit: 2021-04-06 | Payer: Medicaid Other | Source: Ambulatory Visit

## 2021-04-09 ENCOUNTER — Ambulatory Visit: Admission: RE | Admit: 2021-04-09 | Payer: Medicaid Other | Source: Ambulatory Visit

## 2021-04-13 ENCOUNTER — Ambulatory Visit: Admission: RE | Admit: 2021-04-13 | Payer: Medicaid Other | Source: Ambulatory Visit

## 2021-04-16 ENCOUNTER — Ambulatory Visit: Admission: RE | Admit: 2021-04-16 | Payer: Medicaid Other | Source: Ambulatory Visit

## 2021-06-17 ENCOUNTER — Ambulatory Visit
Admission: RE | Admit: 2021-06-17 | Discharge: 2021-06-17 | Disposition: A | Discharge status: Home / Self Care | Payer: Medicaid Other | Source: Ambulatory Visit | Attending: Gastroenterology | Admitting: Gastroenterology

## 2021-06-17 DIAGNOSIS — R1013 Epigastric pain: Secondary | ICD-10-CM | POA: Insufficient documentation

## 2021-06-17 MED ORDER — IOHEXOL 300 MG/ML  SOLN
80.0000 mL | Freq: Once | INTRAMUSCULAR | Status: AC | PRN
  Administered 2021-06-17: 80 mL via INTRAVENOUS

## 2021-07-04 IMAGING — CR DG CHEST 2V
1 series · 2 of 2 positions shown · non-contrast
Comparison: October 29, 2003

CLINICAL DATA: Chest pain and shortness of breath

EXAM:
CHEST - 2 VIEW

[Series 1: dg chest 2 view · 0.14mm/px · 2 of 2 slices shown]
[im 1/2]
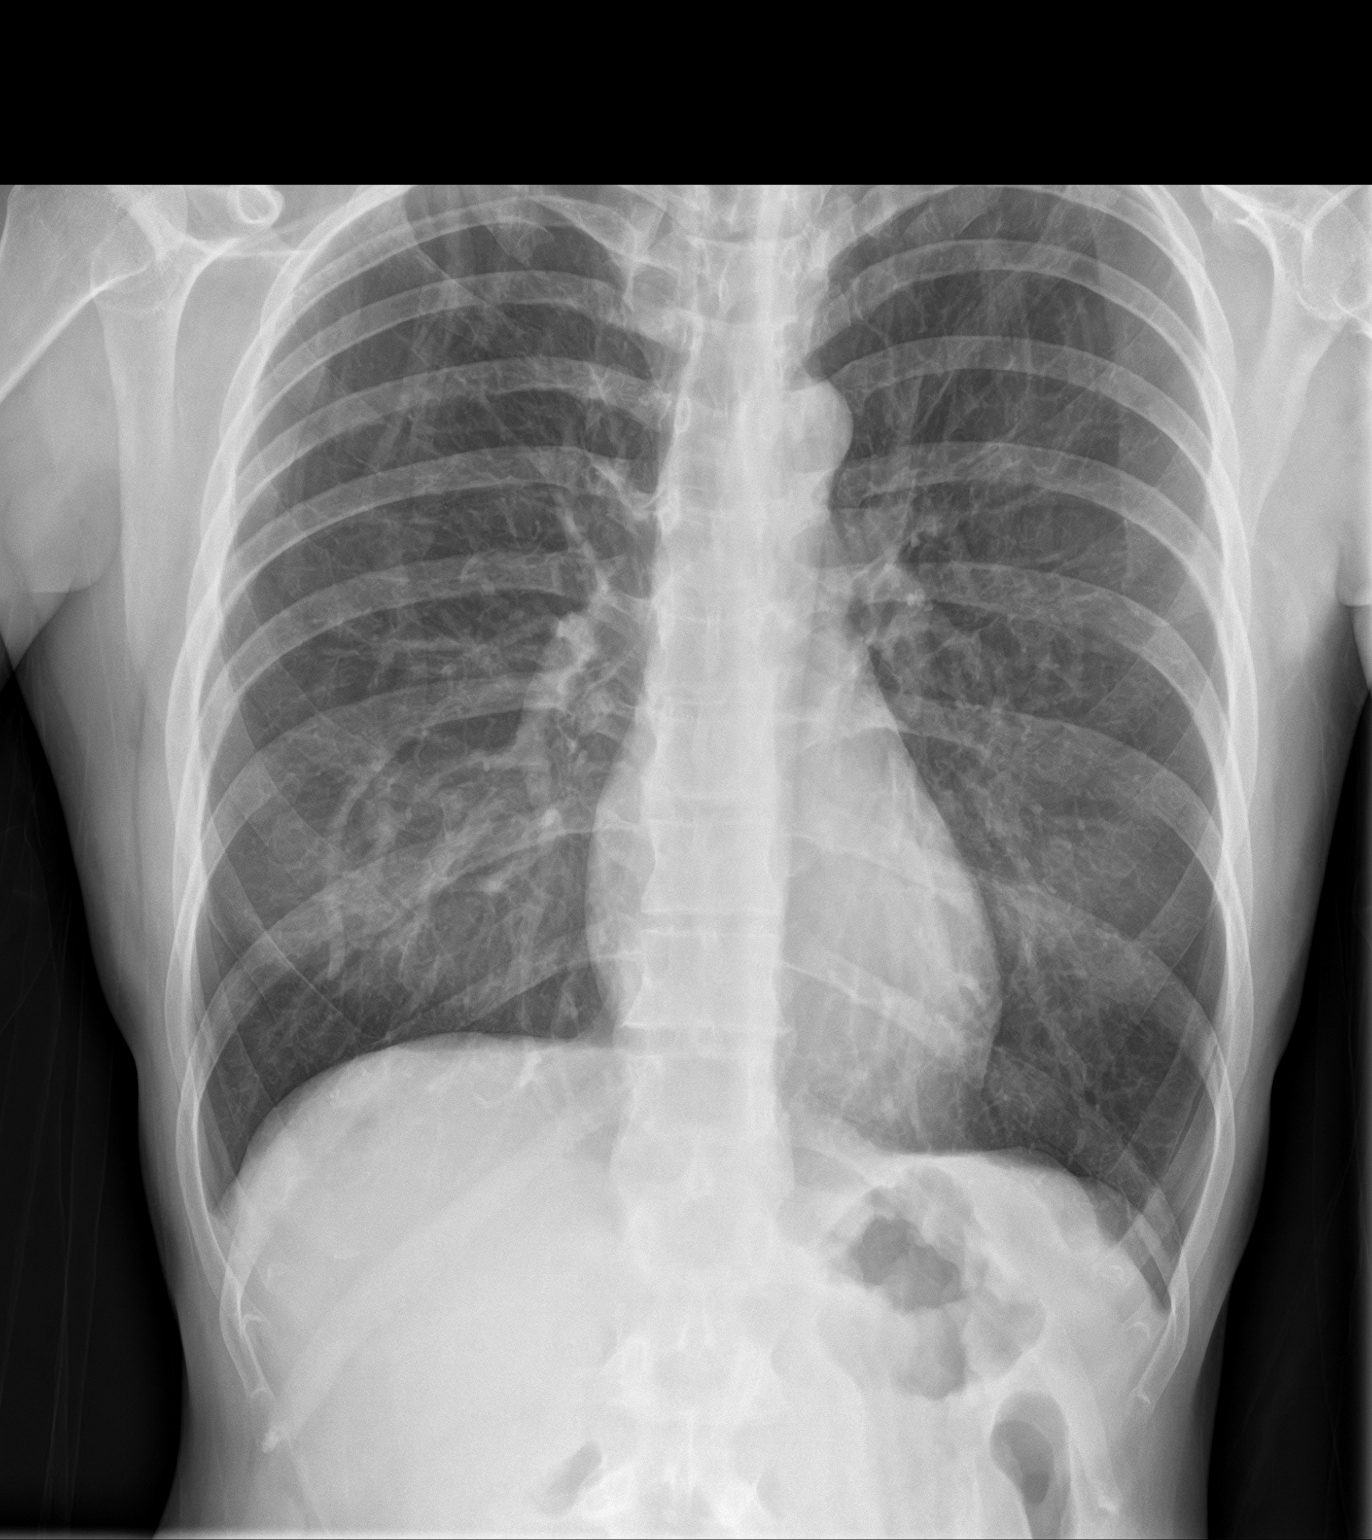
[im 2/2]
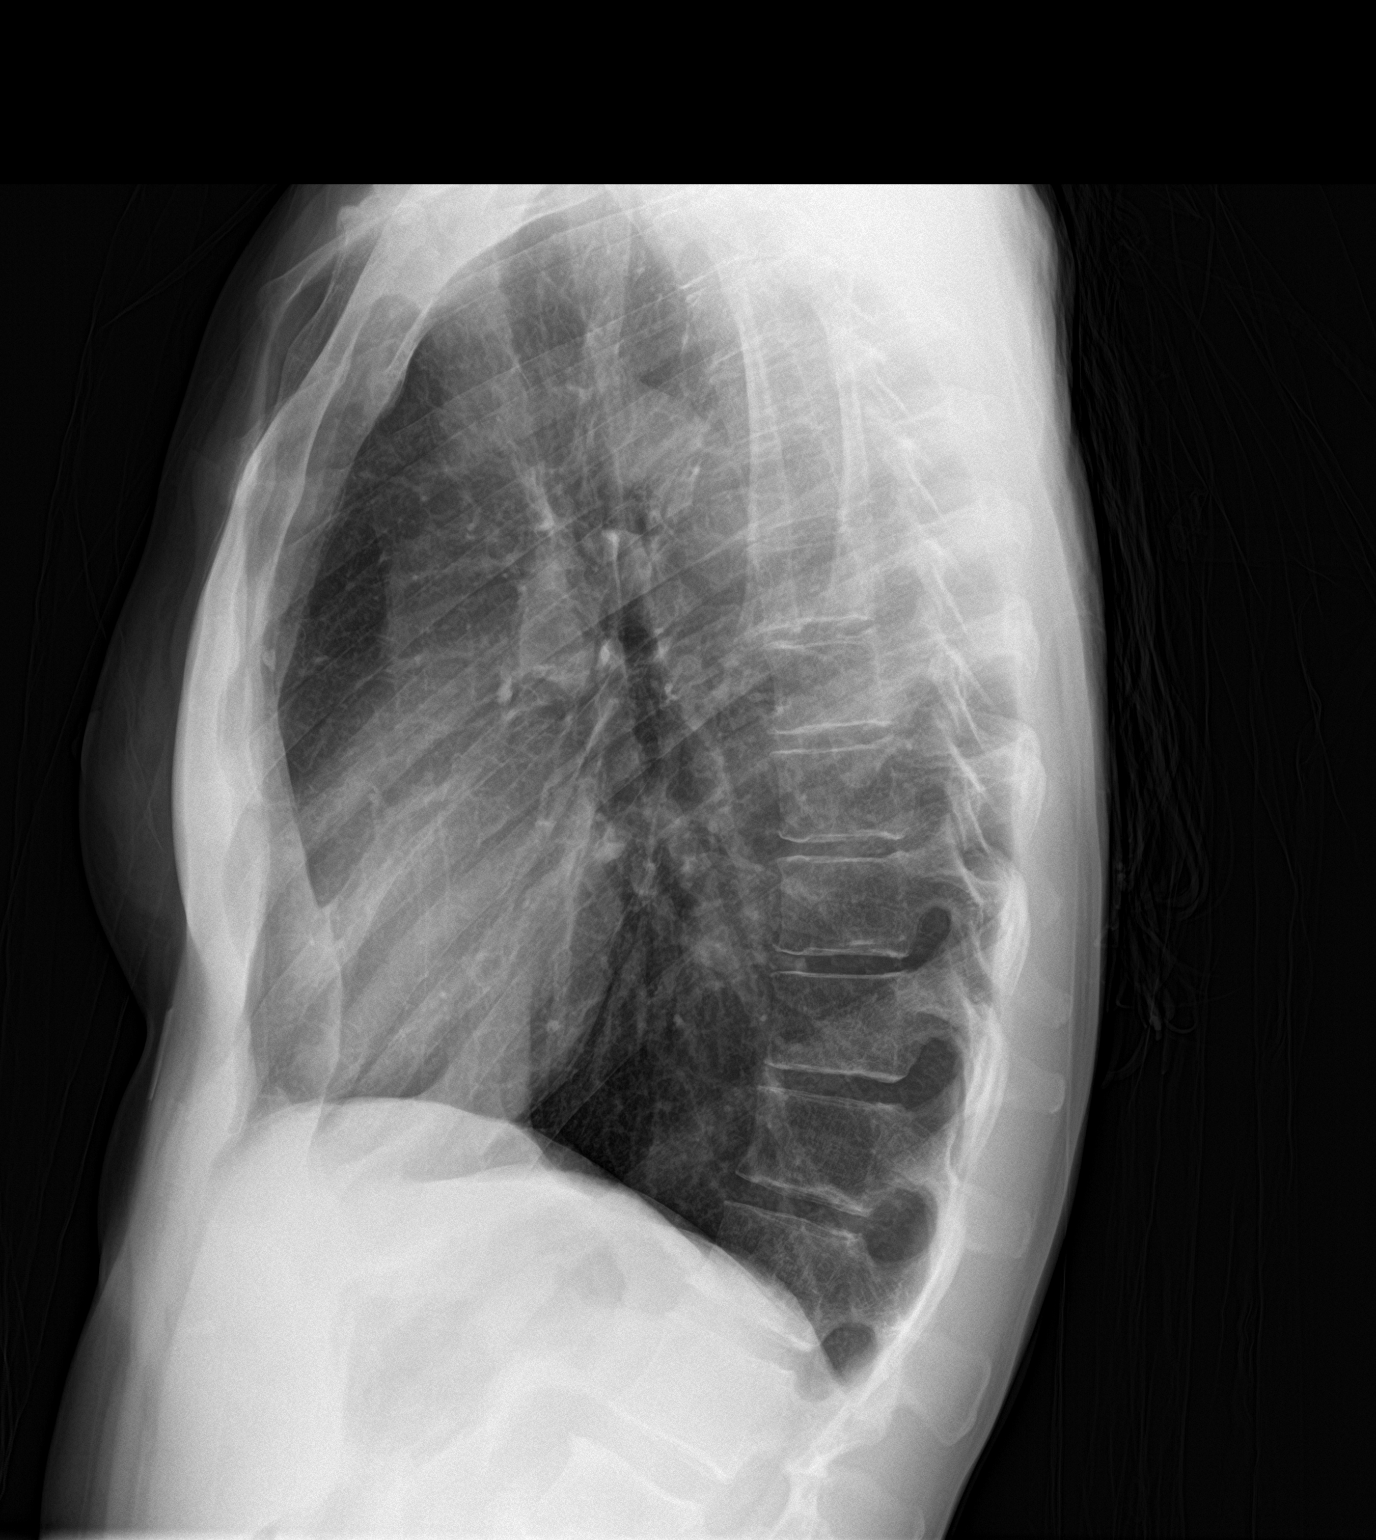

[2 of 2 positions shown; findings below may reference images not displayed]

FINDINGS: Lungs are clear. Heart size and pulmonary vascularity normal. No
adenopathy. No pneumothorax. No bone lesions.
IMPRESSION: Lungs clear.  Cardiac silhouette within normal limits.

## 2021-10-03 ENCOUNTER — Emergency Department
Admission: EM | Admit: 2021-10-03 | Discharge: 2021-10-03 | Disposition: A | Discharge status: Home / Self Care | Payer: Medicaid Other | Attending: Emergency Medicine | Admitting: Emergency Medicine

## 2021-10-03 ENCOUNTER — Other Ambulatory Visit: Payer: Self-pay

## 2021-10-03 DIAGNOSIS — Z2914 Encounter for prophylactic rabies immune globin: Secondary | ICD-10-CM | POA: Insufficient documentation

## 2021-10-03 DIAGNOSIS — S01411A Laceration without foreign body of right cheek and temporomandibular area, initial encounter: Secondary | ICD-10-CM | POA: Insufficient documentation

## 2021-10-03 DIAGNOSIS — W540XXA Bitten by dog, initial encounter: Secondary | ICD-10-CM | POA: Insufficient documentation

## 2021-10-03 DIAGNOSIS — Z203 Contact with and (suspected) exposure to rabies: Secondary | ICD-10-CM | POA: Insufficient documentation

## 2021-10-03 DIAGNOSIS — Z23 Encounter for immunization: Secondary | ICD-10-CM | POA: Insufficient documentation

## 2021-10-03 DIAGNOSIS — I10 Essential (primary) hypertension: Secondary | ICD-10-CM | POA: Diagnosis not present

## 2021-10-03 DIAGNOSIS — S0993XA Unspecified injury of face, initial encounter: Secondary | ICD-10-CM | POA: Diagnosis present

## 2021-10-03 MED ORDER — AMOXICILLIN-POT CLAVULANATE 400-57 MG/5ML PO SUSR
875.0000 mg | Freq: Two times a day (BID) | ORAL | 0 refills | Status: AC
Start: 2021-10-03 — End: 2021-10-13

## 2021-10-03 MED ORDER — RABIES IMMUNE GLOBULIN 150 UNIT/ML IM INJ
20.0000 [IU]/kg | INJECTION | Freq: Once | INTRAMUSCULAR | Status: AC
  Administered 2021-10-03: 1125 [IU]
  Filled 2021-10-03: qty 10
  Filled 2021-10-03: qty 8

## 2021-10-03 MED ORDER — METOCLOPRAMIDE HCL 5 MG/5ML PO SOLN: 10.0000 mg | Freq: Three times a day (TID) | ORAL | 1 refills | Status: AC | PRN

## 2021-10-03 MED ORDER — LIDOCAINE-EPINEPHRINE-TETRACAINE (LET) SOLUTION
3.0000 mL | Freq: Once | NASAL | Status: AC
  Administered 2021-10-03: 3 mL via TOPICAL
  Filled 2021-10-03: qty 3

## 2021-10-03 MED ORDER — RABIES VACCINE, PCEC IM SUSR
1.0000 mL | Freq: Once | INTRAMUSCULAR | Status: AC
  Administered 2021-10-03: 1 mL via INTRAMUSCULAR
  Filled 2021-10-03: qty 1

## 2021-10-03 MED ORDER — TETANUS-DIPHTH-ACELL PERTUSSIS 5-2.5-18.5 LF-MCG/0.5 IM SUSY
0.5000 mL | PREFILLED_SYRINGE | Freq: Once | INTRAMUSCULAR | Status: AC
  Administered 2021-10-03: 0.5 mL via INTRAMUSCULAR
  Filled 2021-10-03: qty 0.5

## 2021-10-03 MED ORDER — LIDOCAINE HCL (PF) 1 % IJ SOLN
5.0000 mL | Freq: Once | INTRAMUSCULAR | Status: AC
  Administered 2021-10-03: 5 mL via INTRADERMAL
  Filled 2021-10-03: qty 5

## 2021-10-03 MED ORDER — BACITRACIN ZINC 500 UNIT/GM EX OINT
TOPICAL_OINTMENT | Freq: Once | CUTANEOUS | Status: AC
  Administered 2021-10-03: 1 via TOPICAL
  Filled 2021-10-03: qty 1.8

## 2021-10-03 MED ORDER — METOCLOPRAMIDE HCL 5 MG/5ML PO SOLN
10.0000 mg | Freq: Once | ORAL | Status: AC
  Administered 2021-10-03: 10 mg via ORAL
  Filled 2021-10-03: qty 10

## 2021-10-03 MED ORDER — AMOXICILLIN-POT CLAVULANATE 400-57 MG/5ML PO SUSR
875.0000 mg | Freq: Once | ORAL | Status: AC
  Administered 2021-10-03: 872 mg via ORAL
  Filled 2021-10-03: qty 10.9

## 2021-10-03 NOTE — Discharge Instructions (Addendum)
Take the antibiotic as directed and the nausea medicine as needed.  Report to: [facility] according to the following schedule to complete your rabies vaccine series.  Rabies Vax 2 of 4: 9/6 Rabies Vax 3 of 4: 9/10 Rabies Vax 4 of 4: 9/17

## 2021-10-03 NOTE — ED Provider Notes (Signed)
[facility] Emergency Department Provider Note     Event Date/Time   First MD Initiated Contact with Patient 10/03/21 1117     (approximate)   History   Animal Bite   HPI  Karen Ortega is a 41 y.o. female with a history of GERD, anxiety, and hypertension, presents to the ED for evaluation of unintentional dog bite to the face.  Patient reports she was feeding a stray dog today, put on her property.  She not seen the dog before.  She reached on the place of plate of food from the dog, and the dog lunged, biting her primarily on the right cheek.  She presents with multiple small puncture wounds and superficial lacerations across the right cheek.  She denies any injury to the eye, or nose bleeding.  She is unclear of her current tetanus status.   Physical Exam   Triage Vital Signs: ED Triage Vitals  Enc Vitals Group     BP 10/03/21 1109 116/86     Pulse Rate 10/03/21 1109 72     Resp 10/03/21 1109 18     Temp 10/03/21 1109 (!) 97.4 F (36.3 C)     Temp Source 10/03/21 1109 Oral     SpO2 10/03/21 1109 99 %     Weight 10/03/21 1110 120 lb (54.4 kg)     Height 10/03/21 1110 5\' 7"  (1.702 m)     Head Circumference --      Peak Flow --      Pain Score 10/03/21 1110 3     Pain Loc --      Pain Edu? --      Excl. in [place]? --     Most recent vital signs: Vitals:   10/03/21 1330 10/03/21 1400  BP: (!) 146/99 131/85  Pulse: 88 84  Resp:    Temp:    SpO2: 100% 99%    General Awake, no distress. NAD HEENT NCAT. PERRL. EOMI. No rhinorrhea. Mucous membranes are moist.  CV:  Good peripheral perfusion.  RESP:  Normal effort.  ABD:  No distention.    ED Results / Procedures / Treatments   Labs (all labs ordered are listed, but only abnormal results are displayed) Labs Reviewed - No data to display   EKG   RADIOLOGY   No results found.   PROCEDURES:  Critical Care performed: No  ..Laceration Repair  Date/Time: 10/03/2021 2:43  PM  Performed by: 12/03/2021, PA-C Authorized by: Lissa Hoard, PA-C   Consent:    Consent obtained:  Verbal   Consent given by:  Patient   Risks, benefits, and alternatives were discussed: yes     Risks discussed:  Pain, poor cosmetic result and poor wound healing   Alternatives discussed:  Delayed treatment Universal protocol:    Procedure explained and questions answered to patient or proxy's satisfaction: yes     Site/side marked: yes     Patient identity confirmed:  Verbally with patient Anesthesia:    Anesthesia method:  Topical application and local infiltration   Topical anesthetic:  LET   Local anesthetic:  Lidocaine 1% w/o epi Laceration details:    Location:  Face   Face location:  R cheek   Length (cm):  4 (& 2)   Depth (mm):  5 Pre-procedure details:    Preparation:  Patient was prepped and draped in usual sterile fashion Exploration:    Limited defect created (wound extended): no  Hemostasis achieved with:  LET   Contaminated: no   Treatment:    Area cleansed with:  Saline and soap and water   Amount of cleaning:  Extensive   Irrigation solution:  Sterile saline   Irrigation volume:  10   Irrigation method:  Pressure wash   Debridement:  None   Undermining:  None   Scar revision: no   Skin repair:    Repair method:  Sutures   Suture size:  6-0   Suture material:  Nylon   Suture technique:  Simple interrupted   Number of sutures:  10 Approximation:    Approximation:  Close Repair type:    Repair type:  Simple Post-procedure details:    Dressing:  Open (no dressing)   Procedure completion:  Tolerated well, no immediate complications    MEDICATIONS ORDERED IN ED: Medications  Tdap (BOOSTRIX) injection 0.5 mL (0.5 mLs Intramuscular Given 10/03/21 1126)  metoCLOPramide (REGLAN) 5 MG/5ML solution 10 mg (10 mg Oral Given 10/03/21 1217)  amoxicillin-clavulanate (AUGMENTIN) 400-57 MG/5ML suspension 872 mg (872 mg Oral Given 10/03/21  1220)  rabies vaccine (RABAVERT) injection 1 mL (1 mL Intramuscular Given 10/03/21 1404)  rabies immune globulin (HYPERRAB/KEDRAB) injection 1,125 Units (1,125 Units Infiltration Given 10/03/21 1410)  lidocaine-EPINEPHrine-tetracaine (LET) solution (3 mLs Topical Given 10/03/21 1222)  lidocaine (PF) (XYLOCAINE) 1 % injection 5 mL (5 mLs Intradermal Given 10/03/21 1438)  bacitracin ointment (1 Application Topical Given 10/03/21 1523)     IMPRESSION / MDM / ASSESSMENT AND PLAN / ED COURSE  I reviewed the triage vital signs and the nursing notes.                              Differential diagnosis includes, but is not limited to, dog bite, cellulitis, facial abscess  Patient's presentation is most consistent with acute, uncomplicated illness.  Patient's diagnosis is consistent with facial laceration secondary to dog bite.  Patient was started prophylactically on Augmentin and started the rabies vaccine prophylaxis including RIG.  Patient will be discharged home with prescriptions for Augmentin and Reglan. Patient is to follow up with PCP or local urgent care in 3 days for second of 4 rabies vaccine.  As needed or otherwise directed. Patient is given ED precautions to return to the ED for any worsening or new symptoms.     FINAL CLINICAL IMPRESSION(S) / ED DIAGNOSES   Final diagnoses:  Dog bite of face, initial encounter     Rx / DC Orders   ED Discharge Orders          Ordered    metoCLOPramide (REGLAN) 5 MG/5ML solution  Every 8 hours PRN        10/03/21 1405    amoxicillin-clavulanate (AUGMENTIN) 400-57 MG/5ML suspension  2 times daily        10/03/21 1405             Note:  This document was prepared using Dragon voice recognition software and may include unintentional dictation errors.    Lissa Hoard, PA-C 10/04/21 1936    Pilar Jarvis, MD 10/05/21 [phone]

## 2021-10-03 NOTE — ED Triage Notes (Signed)
Pt states she was helping a stray dog and it bit her in the face- husband is calling animal control- pt has multiple small puncture wounds to the R side of her face and 2 small lacerations- bleeding minimal at this time

## 2021-10-07 ENCOUNTER — Ambulatory Visit
Admission: EM | Admit: 2021-10-07 | Discharge: 2021-10-07 | Disposition: A | Discharge status: Home / Self Care | Payer: Medicaid Other | Attending: Emergency Medicine | Admitting: Emergency Medicine

## 2021-10-07 ENCOUNTER — Encounter: Payer: Self-pay | Admitting: Emergency Medicine

## 2021-10-07 DIAGNOSIS — Z23 Encounter for immunization: Secondary | ICD-10-CM | POA: Diagnosis not present

## 2021-10-07 DIAGNOSIS — Z203 Contact with and (suspected) exposure to rabies: Secondary | ICD-10-CM | POA: Diagnosis not present

## 2021-10-07 MED ORDER — RABIES VACCINE, PCEC IM SUSR
1.0000 mL | Freq: Once | INTRAMUSCULAR | Status: AC
  Administered 2021-10-07: 1 mL via INTRAMUSCULAR

## 2021-10-07 NOTE — ED Triage Notes (Signed)
Patient presents for second rabies injection.   Patient has no complaints.

## 2021-10-10 ENCOUNTER — Ambulatory Visit
Admission: EM | Admit: 2021-10-10 | Discharge: 2021-10-10 | Disposition: A | Discharge status: Home / Self Care | Payer: Medicaid Other | Attending: Emergency Medicine | Admitting: Emergency Medicine

## 2021-10-10 DIAGNOSIS — Z23 Encounter for immunization: Secondary | ICD-10-CM | POA: Diagnosis not present

## 2021-10-10 DIAGNOSIS — Z203 Contact with and (suspected) exposure to rabies: Secondary | ICD-10-CM

## 2021-10-10 MED ORDER — RABIES VACCINE, PCEC IM SUSR
1.0000 mL | Freq: Once | INTRAMUSCULAR | Status: AC
  Administered 2021-10-10: 1 mL via INTRAMUSCULAR

## 2021-10-10 NOTE — ED Notes (Signed)
Rabies vaccine administered, tolerated well. Instructed to return day 14 for last dose. Verbalized understanding.

## 2021-10-10 NOTE — ED Triage Notes (Signed)
Patient presents to UC for suture removal and day 7 rabies vaccine dose. Wound was assessed by provider. No s/s of infection noted.

## 2021-12-06 ENCOUNTER — Other Ambulatory Visit: Payer: Self-pay | Admitting: Student

## 2021-12-06 DIAGNOSIS — R748 Abnormal levels of other serum enzymes: Secondary | ICD-10-CM

## 2021-12-13 ENCOUNTER — Ambulatory Visit
Admission: RE | Admit: 2021-12-13 | Discharge: 2021-12-13 | Disposition: A | Discharge status: Home / Self Care | Payer: Medicaid Other | Source: Ambulatory Visit | Attending: Student | Admitting: Student

## 2021-12-13 DIAGNOSIS — R748 Abnormal levels of other serum enzymes: Secondary | ICD-10-CM | POA: Insufficient documentation

## 2022-02-13 ENCOUNTER — Ambulatory Visit
Admission: EM | Admit: 2022-02-13 | Discharge: 2022-02-13 | Disposition: A | Discharge status: Home / Self Care | Payer: Medicaid Other

## 2022-02-13 DIAGNOSIS — J0100 Acute maxillary sinusitis, unspecified: Secondary | ICD-10-CM

## 2022-02-13 MED ORDER — AMOXICILLIN 875 MG PO TABS: 875.0000 mg | ORAL_TABLET | Freq: Two times a day (BID) | ORAL | 0 refills | Status: AC

## 2022-02-13 NOTE — Discharge Instructions (Signed)
Take the amoxicillin as directed.  Follow up with your primary care provider if your symptoms are not improving.   ° ° °

## 2022-02-13 NOTE — ED Triage Notes (Addendum)
Patient to Urgent Care with complaints of cough and chest congestion x8 days. Productive cough with discolored mucus. Has been taking mucinex and aleve.   Reports having high anxiety and multiple stressors in her home life. Prescribed hydroxyzine- reports last dose was three days ago.   Denies any known fevers. Poor appetite. Reports vomiting- states she has an esophageal hernia that is being followed by GI. Taking omeprazole/ carafate.

## 2022-02-13 NOTE — ED Provider Notes (Signed)
Roderic Palau    CSN: [id number] Arrival date & time: 02/13/22  0830      History   Chief Complaint Chief Complaint  Patient presents with   Cough    HPI Karen Ortega is a 42 y.o. female.  Patient presents with 8 day history of congestion and cough.  She denies fever, sore throat, shortness of breath, or other symptoms.  Treating symptoms with Mucinex and Aleve.  Her medical history includes hypertension, angina, GERD, anxiety. Current everyday smoker.   The history is provided by the patient and medical records.    Past Medical History:  Diagnosis Date   Anxiety disorder    Atherosclerosis of aorta (HCC)    Chest pain, unspecified    Gastro-esophageal reflux disease without esophagitis    Hypertension    Vitamin D deficiency     Patient Active Problem List   Diagnosis Date Noted   Angina pectoris (Portal) 04/29/2020   Cigarette smoker 04/29/2020   Atherosclerosis of aorta (HCC)    Chest pain, unspecified    Vitamin D deficiency    Gastro-esophageal reflux disease without esophagitis    Anxiety disorder     Past Surgical History:  Procedure Laterality Date   DILATION AND EVACUATION N/A 06/04/2020   Procedure: DILATATION AND EVACUATION;  Surgeon: Homero Fellers, MD;  Location: [facility] ORS;  Service: Gynecology;  Laterality: N/A;   NO PAST SURGERIES      OB History     Gravida  1   Para  0   Term  0   Preterm  0   AB  0   Living  1      SAB  0   IAB  0   Ectopic  0   Multiple      Live Births               Home Medications    Prior to Admission medications   Medication Sig Start Date End Date Taking? Authorizing Provider  amoxicillin (AMOXIL) 875 MG tablet Take 1 tablet (875 mg total) by mouth 2 (two) times daily for 10 days. 02/13/22 02/23/22 Yes Sharion Balloon, NP  omeprazole (PRILOSEC) 20 MG capsule Take 20 mg by mouth daily. 12/14/21  Yes [provider]  hydrOXYzine (ATARAX) 25 MG tablet Take 25 mg by mouth 3  (three) times daily as needed.    [provider]  meclizine (ANTIVERT) 25 MG tablet Take 25-50 mg by mouth 3 (three) times daily as needed for nausea (omiting).    [provider]  metoCLOPramide (REGLAN) 5 MG/5ML solution Take 10 mLs (10 mg total) by mouth every 8 (eight) hours as needed for nausea. 10/03/21   Menshew, Dannielle Karvonen, PA-C  sucralfate (CARAFATE) 1 g tablet Take 1 g by mouth 4 (four) times daily.    [provider]    Family History Family History  Problem Relation Age of Onset   Arthritis Mother    Depression Mother    Heart disease Mother    Arthritis Father    Depression Father    Heart disease Father    Breast cancer Cousin 43    Social History Social History   Tobacco Use   Smoking status: Every Day    Packs/day: 0.50    Types: Cigarettes   Smokeless tobacco: Never  Vaping Use   Vaping Use: Never used  Substance Use Topics   Alcohol use: Yes    Comment: 5 per day  Drug use: Never     Allergies   Bee venom and Propoxyphene   Review of Systems Review of Systems  Constitutional:  Negative for chills and fever.  HENT:  Positive for congestion. Negative for ear pain and sore throat.   Respiratory:  Positive for cough. Negative for shortness of breath.   Cardiovascular:  Negative for chest pain and palpitations.  All other systems reviewed and are negative.    Physical Exam Triage Vital Signs ED Triage Vitals  Enc Vitals Group     BP 02/13/22 0904 (!) 132/91     Pulse Rate 02/13/22 0853 (!) 127     Resp 02/13/22 0853 18     Temp 02/13/22 0853 97.9 F (36.6 C)     Temp src --      SpO2 02/13/22 0853 98 %     Weight 02/13/22 0902 118 lb (53.5 kg)     Height 02/13/22 0902 5\' 7"  (1.702 m)     Head Circumference --      Peak Flow --      Pain Score 02/13/22 0856 3     Pain Loc --      Pain Edu? --      Excl. in [place]? --    No data found.  Updated Vital Signs BP (!) 132/91   Pulse (!) 127   Temp 97.9 F (36.6  C)   Resp 18   Ht 5\' 7"  (1.702 m)   Wt 118 lb (53.5 kg)   SpO2 98%   BMI 18.48 kg/m   Visual Acuity Right Eye Distance:   Left Eye Distance:   Bilateral Distance:    Right Eye Near:   Left Eye Near:    Bilateral Near:     Physical Exam Vitals and nursing note reviewed.  Constitutional:      General: She is not in acute distress.    Appearance: Normal appearance. She is well-developed. She is not ill-appearing.  HENT:     Right Ear: Tympanic membrane normal.     Left Ear: Tympanic membrane normal.     Nose: Congestion and rhinorrhea present.     Mouth/Throat:     Mouth: Mucous membranes are moist.     Pharynx: Oropharynx is clear.  Cardiovascular:     Rate and Rhythm: Normal rate and regular rhythm.     Heart sounds: Normal heart sounds.  Pulmonary:     Effort: Pulmonary effort is normal. No respiratory distress.     Breath sounds: Normal breath sounds.  Musculoskeletal:     Cervical back: Neck supple.  Skin:    General: Skin is warm and dry.  Neurological:     Mental Status: She is alert.  Psychiatric:        Mood and Affect: Mood normal.        Behavior: Behavior normal.      UC Treatments / Results  Labs (all labs ordered are listed, but only abnormal results are displayed) Labs Reviewed - No data to display  EKG   Radiology No results found.  Procedures Procedures (including critical care time)  Medications Ordered in UC Medications - No data to display  Initial Impression / Assessment and Plan / UC Course  I have reviewed the triage vital signs and the nursing notes.  Pertinent labs & imaging results that were available during my care of the patient were reviewed by me and considered in my medical decision making (see chart for details).  Acute sinusitis.  Patient has been symptomatic for 8 days and is not improving with OTC treatment.  Treating today with amoxicillin.  Discussed symptomatic treatment including Tylenol, rest, hydration.   Instructed patient to follow up with PCP if symptoms are not improving.  She agrees to plan of care.   Final Clinical Impressions(s) / UC Diagnoses   Final diagnoses:  Acute non-recurrent maxillary sinusitis     Discharge Instructions      Take the amoxicillin as directed.  Follow up with your primary care provider if your symptoms are not improving.        ED Prescriptions     Medication Sig Dispense Auth. Provider   amoxicillin (AMOXIL) 875 MG tablet Take 1 tablet (875 mg total) by mouth 2 (two) times daily for 10 days. 20 tablet Mickie Bail, NP      PDMP not reviewed this encounter.   Mickie Bail, NP 02/13/22 [phone]

## 2022-02-19 ENCOUNTER — Ambulatory Visit: Payer: Self-pay

## 2022-03-09 ENCOUNTER — Other Ambulatory Visit: Payer: Self-pay | Admitting: Internal Medicine

## 2022-03-09 DIAGNOSIS — R002 Palpitations: Secondary | ICD-10-CM

## 2022-03-09 DIAGNOSIS — I2510 Atherosclerotic heart disease of native coronary artery without angina pectoris: Secondary | ICD-10-CM

## 2022-03-11 ENCOUNTER — Inpatient Hospital Stay: Admission: RE | Admit: 2022-03-11 | Payer: Medicaid Other | Source: Ambulatory Visit

## 2022-03-15 ENCOUNTER — Encounter: Payer: Medicaid Other | Admitting: Obstetrics and Gynecology

## 2022-03-16 ENCOUNTER — Inpatient Hospital Stay: Admission: RE | Admit: 2022-03-16 | Payer: Medicaid Other | Source: Ambulatory Visit

## 2022-03-17 ENCOUNTER — Ambulatory Visit: Payer: Medicaid Other

## 2022-03-17 DIAGNOSIS — K2950 Unspecified chronic gastritis without bleeding: Secondary | ICD-10-CM

## 2022-03-23 ENCOUNTER — Inpatient Hospital Stay: Admission: RE | Admit: 2022-03-23 | Payer: Medicaid Other | Source: Ambulatory Visit

## 2022-03-29 ENCOUNTER — Other Ambulatory Visit: Payer: Self-pay | Admitting: Gastroenterology

## 2022-03-29 DIAGNOSIS — K529 Noninfective gastroenteritis and colitis, unspecified: Secondary | ICD-10-CM

## 2022-04-15 ENCOUNTER — Other Ambulatory Visit: Payer: Self-pay | Admitting: Neurology

## 2022-04-15 DIAGNOSIS — M5412 Radiculopathy, cervical region: Secondary | ICD-10-CM

## 2022-04-15 DIAGNOSIS — M542 Cervicalgia: Secondary | ICD-10-CM

## 2022-04-15 DIAGNOSIS — R200 Anesthesia of skin: Secondary | ICD-10-CM

## 2022-04-20 ENCOUNTER — Encounter: Payer: Self-pay | Admitting: Neurology

## 2022-04-29 ENCOUNTER — Ambulatory Visit
Admission: RE | Admit: 2022-04-29 | Discharge: 2022-04-29 | Disposition: A | Discharge status: Home / Self Care | Payer: Medicaid Other | Source: Ambulatory Visit | Attending: Neurology | Admitting: Neurology

## 2022-04-29 DIAGNOSIS — R202 Paresthesia of skin: Secondary | ICD-10-CM

## 2022-04-29 DIAGNOSIS — M542 Cervicalgia: Secondary | ICD-10-CM

## 2022-04-29 DIAGNOSIS — M5412 Radiculopathy, cervical region: Secondary | ICD-10-CM

## 2022-05-12 ENCOUNTER — Other Ambulatory Visit: Payer: Medicaid Other

## 2022-05-20 ENCOUNTER — Other Ambulatory Visit: Payer: Self-pay | Admitting: Neurology

## 2022-05-20 ENCOUNTER — Other Ambulatory Visit: Payer: Medicaid Other

## 2022-05-20 DIAGNOSIS — R200 Anesthesia of skin: Secondary | ICD-10-CM

## 2022-05-26 IMAGING — US US OB < 14 WEEKS - US OB TV
1 series · 14 of 28 positions shown · non-contrast
Comparison: None

CLINICAL DATA: First trimester pregnancy, vaginal bleeding,
uncertain LMP/dates

EXAM:
OBSTETRIC <14 WK US AND TRANSVAGINAL OB US
TECHNIQUE: Both transabdominal and transvaginal ultrasound examinations were
performed for complete evaluation of the gestation as well as the
maternal uterus, adnexal regions, and pelvic cul-de-sac.
Transvaginal technique was performed to assess early pregnancy.

[Series 1: us ob less than 14 weeks with ob transvaginal · 14 of 93 slices shown]
[im 4/93]
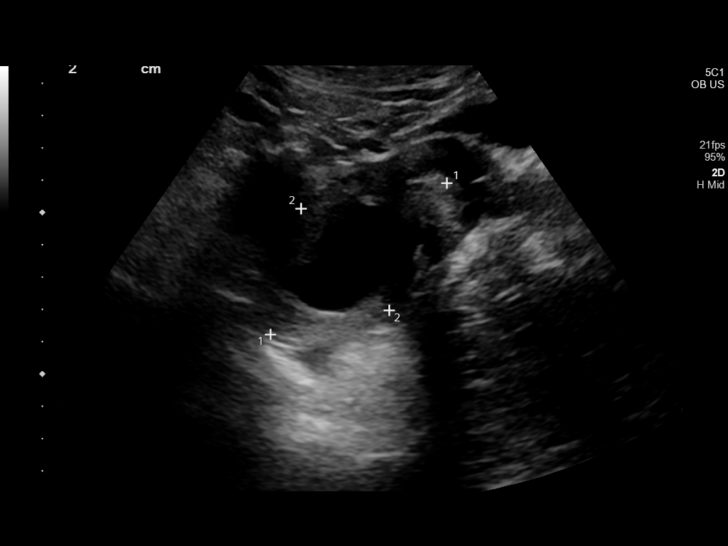
[im 11/93]
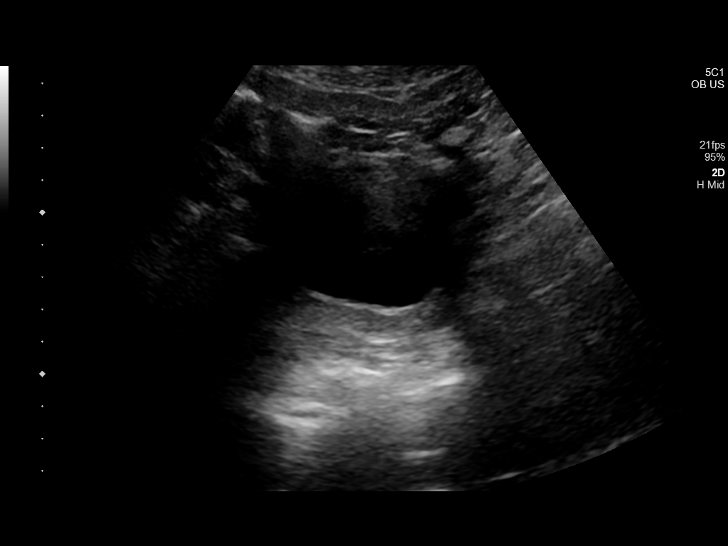
[im 18/93]
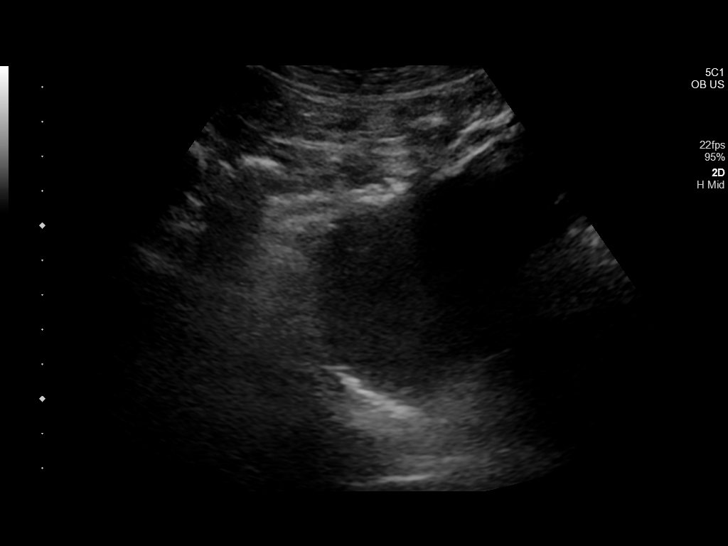
[im 24/93]
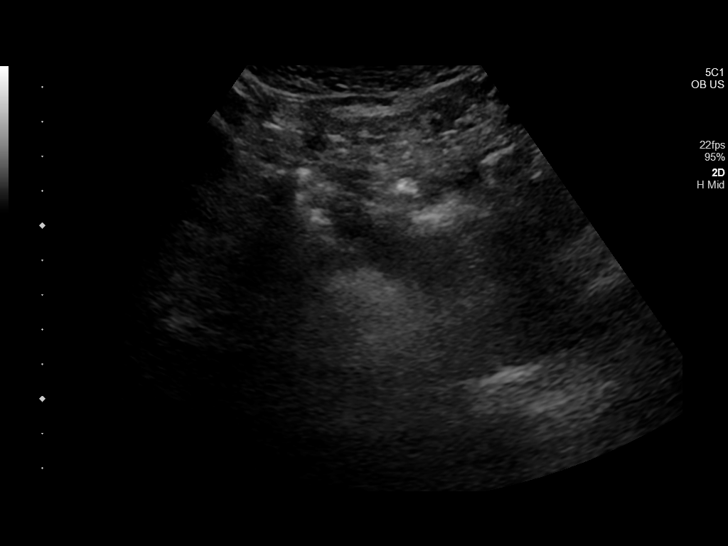
[im 31/93]
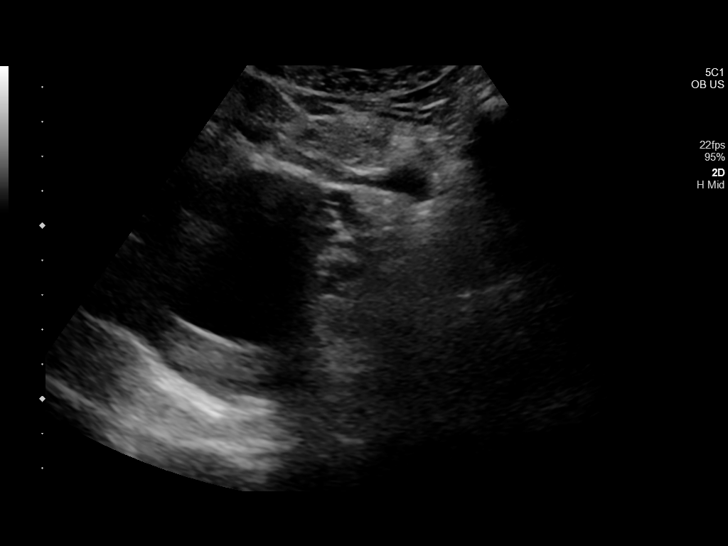
[im 38/93]
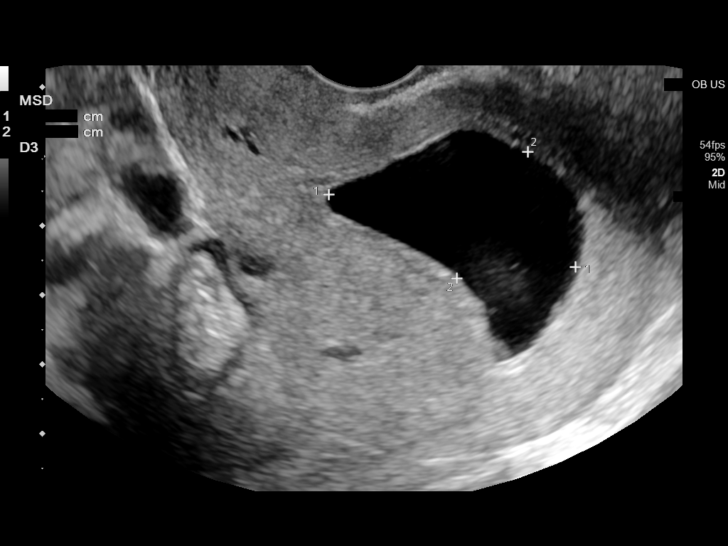
[im 45/93]
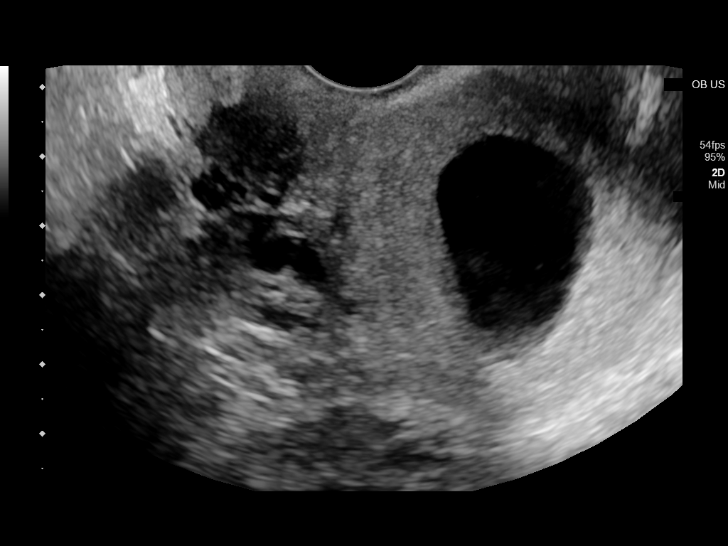
[im 52/93]
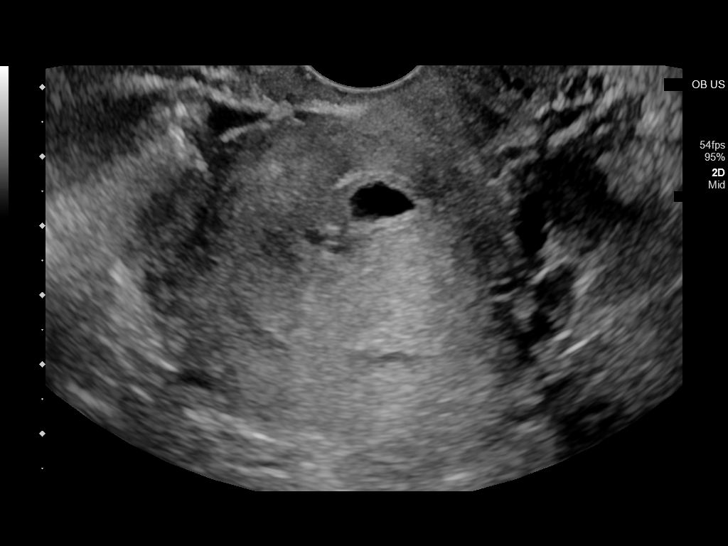
[im 58/93]
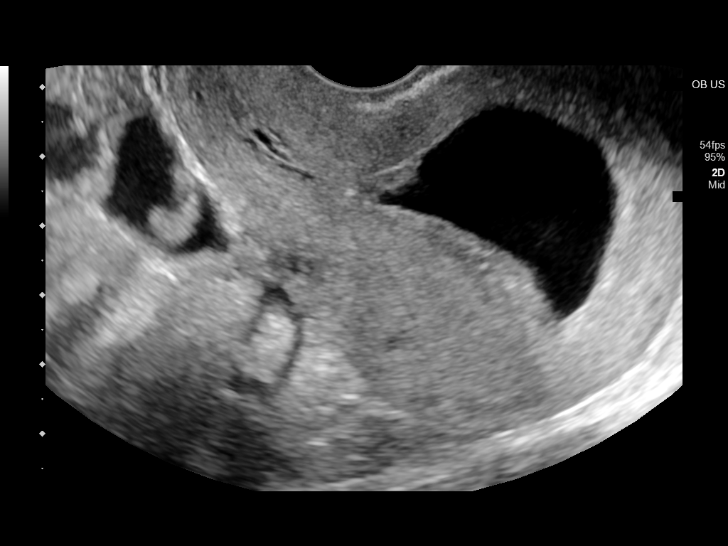
[im 65/93]
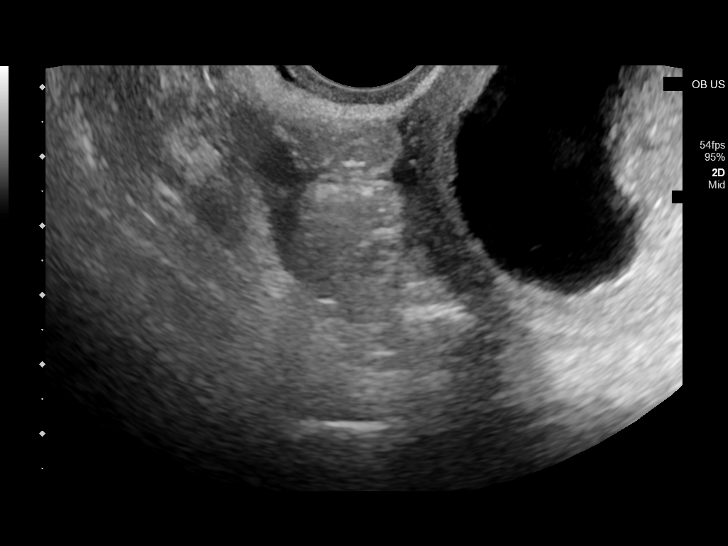
[im 72/93]
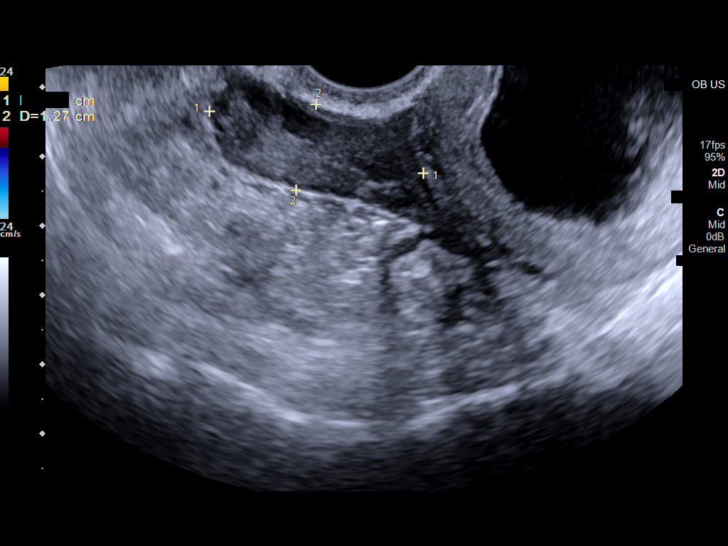
[im 79/93]
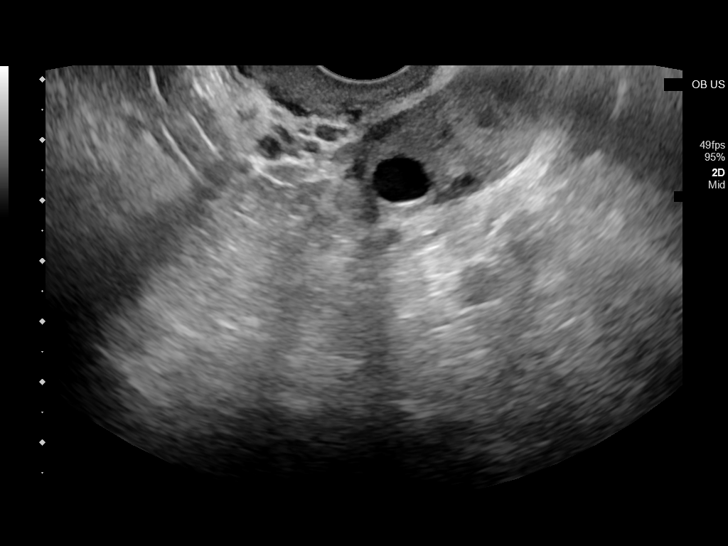
[im 86/93]
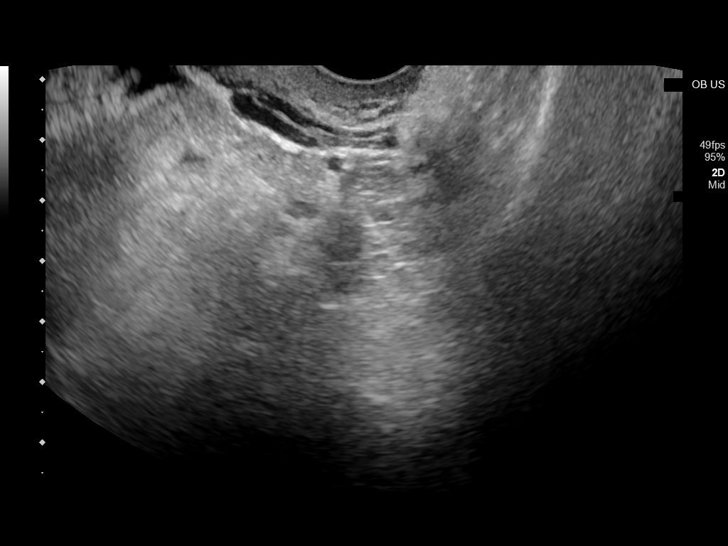
[im 93/93]
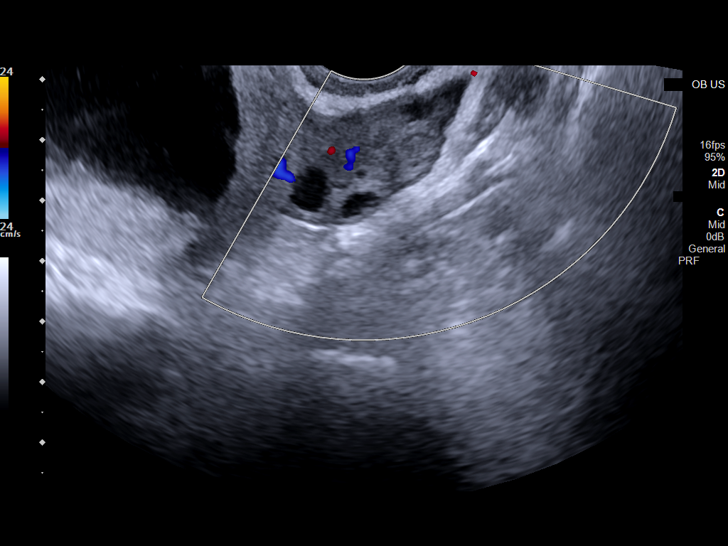

[14 of 28 positions shown; findings below may reference images not displayed]

FINDINGS: Intrauterine gestational sac: Present, single, slightly irregular

Yolk sac:  Absent

Embryo:  Absent

Cardiac Activity: N/A

Heart Rate: N/A  bpm

MSD: 33.5 mm   8 w   4 d

Subchorionic hemorrhage:  None

Maternal uterus/adnexae: Uterus retroverted, otherwise unremarkable.

RIGHT ovary normal size and morphology 3.2 x 1.3 x 2.3 cm.

LEFT ovary measures 3.7 x 1.8 x 2.2 cm and contains a small corpus
luteum.

No free pelvic fluid or adnexal masses.
IMPRESSION: Large slightly irregular empty gestational sac within the uterus.

Findings meet definitive criteria for failed pregnancy. This follows
SRU consensus guidelines: Diagnostic Criteria for Nonviable
Pregnancy Early in the First Trimester. N Engl J Med

## 2022-06-01 ENCOUNTER — Ambulatory Visit
Admission: RE | Admit: 2022-06-01 | Discharge: 2022-06-01 | Disposition: A | Discharge status: Home / Self Care | Payer: Medicaid Other | Source: Ambulatory Visit | Attending: Neurology | Admitting: Neurology

## 2022-06-01 DIAGNOSIS — R200 Anesthesia of skin: Secondary | ICD-10-CM

## 2022-06-14 ENCOUNTER — Telehealth: Payer: Self-pay

## 2022-06-14 NOTE — Telephone Encounter (Signed)
Patient is calling because she states she has been a establish patient with [facility] but is not happy with her care and is wanting to transfer her care to our office. She states her PCP is referring her to our office to be seen. She states she is having abdominal pain that is making her not able to sleep. Diarrhea. She has been diagnosis with a hiatal hernia. They have order a gastric emptying study for her but she has not had it done yet she states. Would yall be will to see patient as a transfer?

## 2022-06-15 ENCOUNTER — Other Ambulatory Visit: Payer: Self-pay

## 2022-06-15 NOTE — Telephone Encounter (Signed)
Patient returned call and she verbalized understanding

## 2022-06-15 NOTE — Telephone Encounter (Signed)
Called and left a message for call back  

## 2022-06-15 NOTE — Telephone Encounter (Signed)
Agree me neither

## 2022-06-24 ENCOUNTER — Encounter: Payer: Medicaid Other | Attending: Gastroenterology

## 2022-06-30 ENCOUNTER — Other Ambulatory Visit: Payer: Self-pay | Admitting: Student

## 2022-06-30 DIAGNOSIS — Q450 Agenesis, aplasia and hypoplasia of pancreas: Secondary | ICD-10-CM

## 2022-07-08 ENCOUNTER — Ambulatory Visit
Admission: RE | Admit: 2022-07-08 | Discharge: 2022-07-08 | Disposition: A | Discharge status: Home / Self Care | Payer: Medicaid Other | Source: Ambulatory Visit | Attending: Student | Admitting: Student

## 2022-07-08 DIAGNOSIS — Q450 Agenesis, aplasia and hypoplasia of pancreas: Secondary | ICD-10-CM | POA: Diagnosis present

## 2022-07-08 MED ORDER — IOHEXOL 300 MG/ML  SOLN
100.0000 mL | Freq: Once | INTRAMUSCULAR | Status: AC | PRN
  Administered 2022-07-08: 100 mL via INTRAVENOUS

## 2022-07-21 ENCOUNTER — Other Ambulatory Visit: Payer: Self-pay | Admitting: Gastroenterology

## 2022-07-21 DIAGNOSIS — K869 Disease of pancreas, unspecified: Secondary | ICD-10-CM

## 2022-07-22 ENCOUNTER — Other Ambulatory Visit: Payer: Self-pay | Admitting: Gastroenterology

## 2022-07-22 DIAGNOSIS — K869 Disease of pancreas, unspecified: Secondary | ICD-10-CM

## 2022-08-12 ENCOUNTER — Inpatient Hospital Stay: Admission: RE | Admit: 2022-08-12 | Payer: Medicaid Other | Source: Ambulatory Visit

## 2022-08-20 ENCOUNTER — Ambulatory Visit
Admission: RE | Admit: 2022-08-20 | Discharge: 2022-08-20 | Disposition: A | Discharge status: Home / Self Care | Payer: Medicaid Other | Source: Ambulatory Visit | Attending: Gastroenterology | Admitting: Gastroenterology

## 2022-08-20 DIAGNOSIS — K869 Disease of pancreas, unspecified: Secondary | ICD-10-CM

## 2022-09-16 ENCOUNTER — Encounter: Payer: Self-pay | Admitting: Gastroenterology

## 2022-09-20 ENCOUNTER — Other Ambulatory Visit: Payer: Medicaid Other

## 2022-09-26 ENCOUNTER — Other Ambulatory Visit: Payer: Self-pay | Admitting: Nurse Practitioner

## 2022-09-26 DIAGNOSIS — N644 Mastodynia: Secondary | ICD-10-CM

## 2022-09-26 IMAGING — US US ABDOMEN COMPLETE
1 series · 14 of 25 positions shown · non-contrast
Comparison: None.

CLINICAL DATA: Epigastric pain.

EXAM:
ABDOMEN ULTRASOUND COMPLETE

[Series 1: us abdomen complete · 14 of 84 slices shown]
[im 1/84]
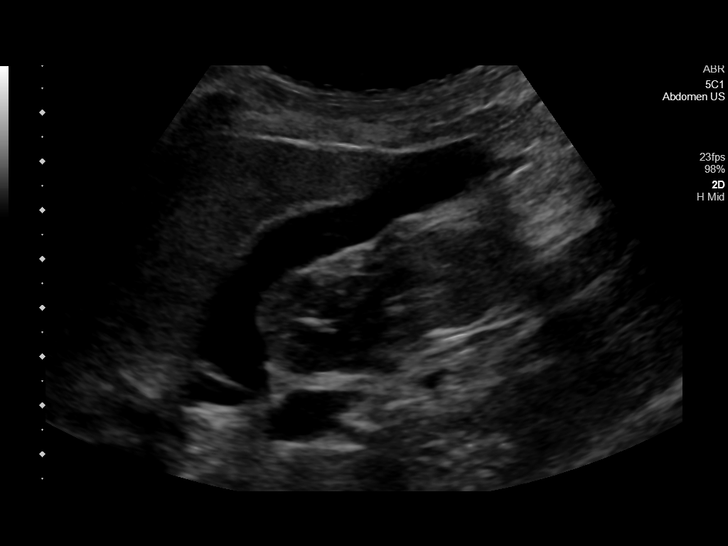
[im 7/84]
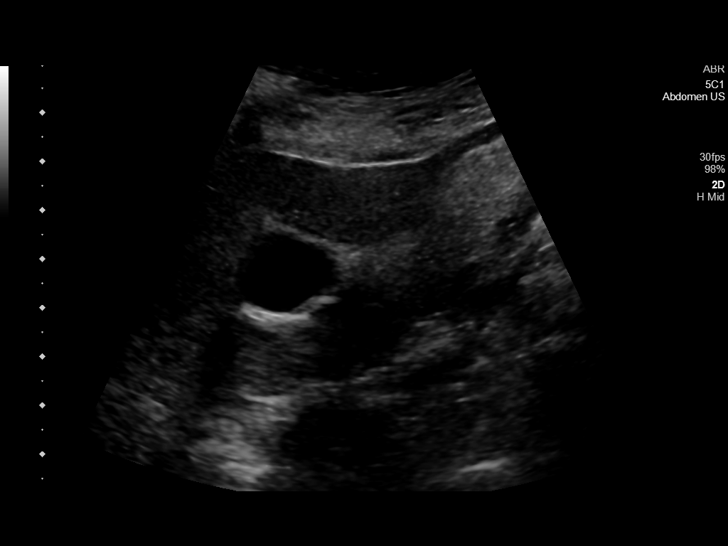
[im 14/84]
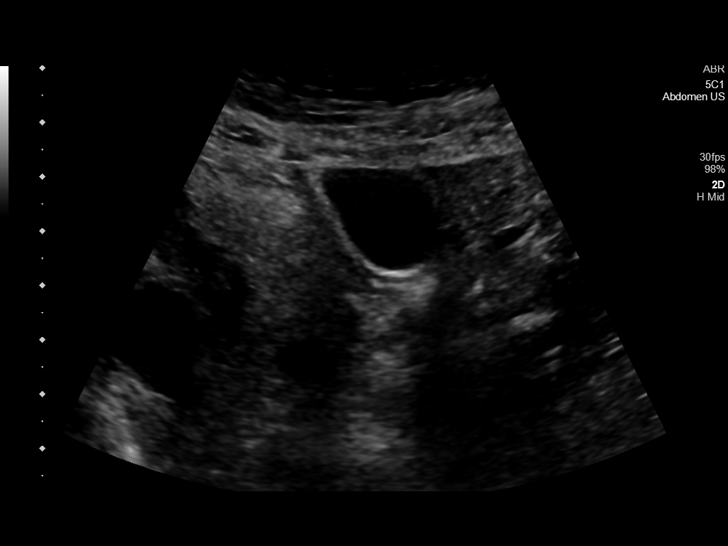
[im 21/84]
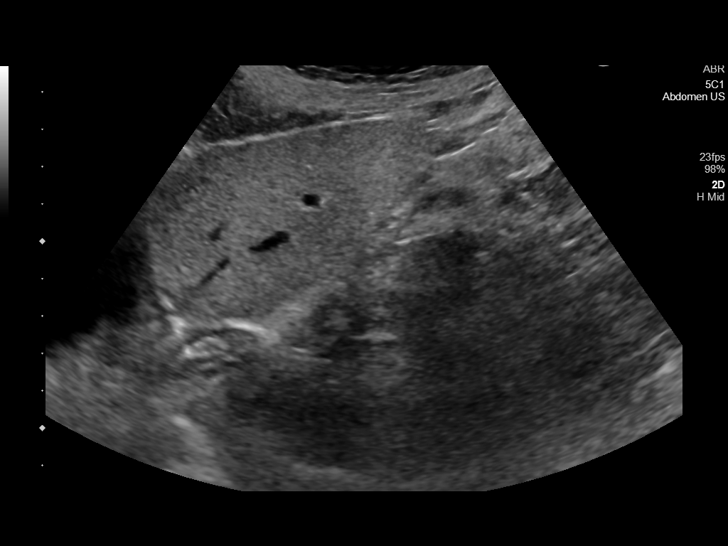
[im 28/84]
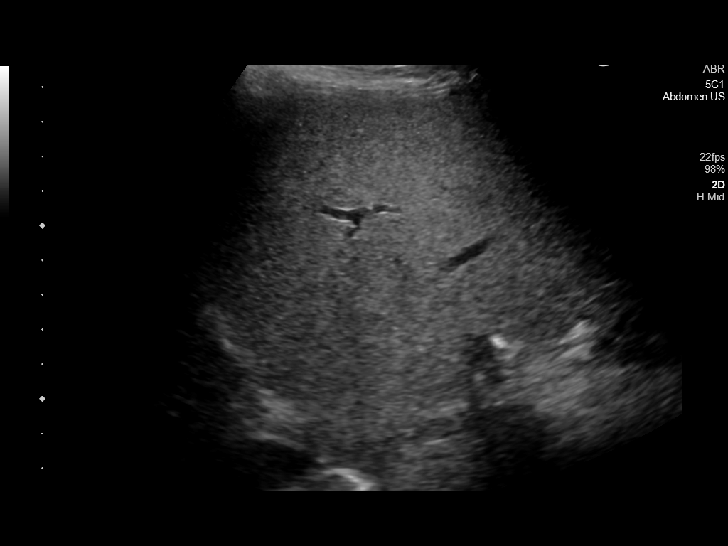
[im 32/84]
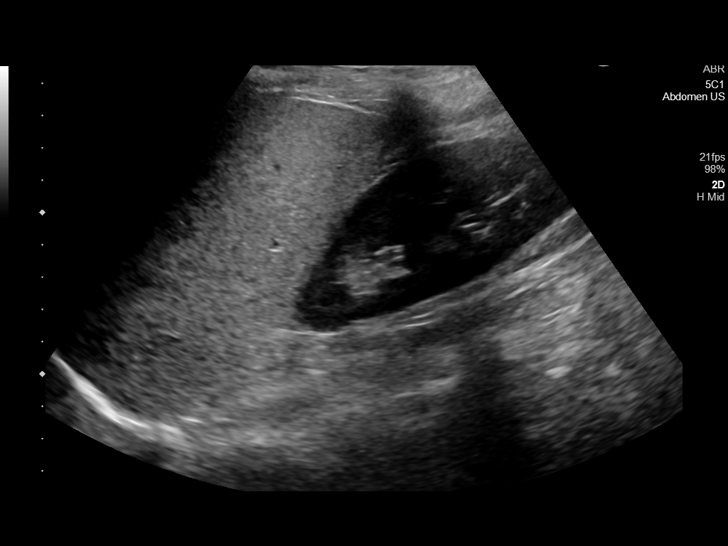
[im 39/84]
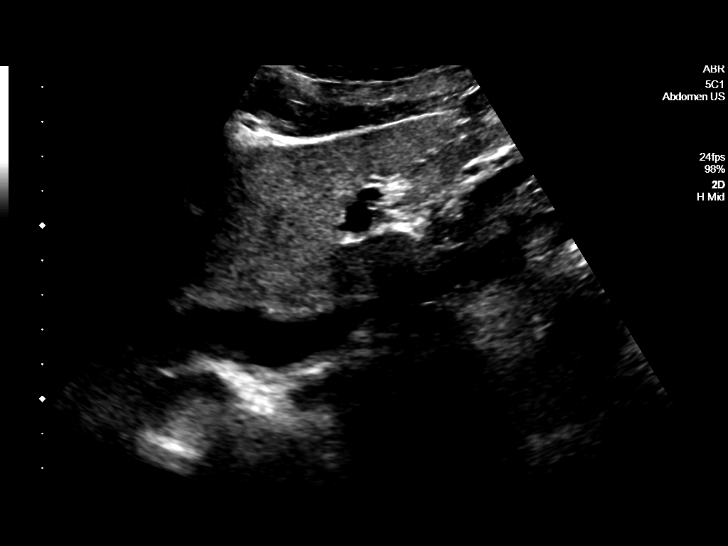
[im 45/84]
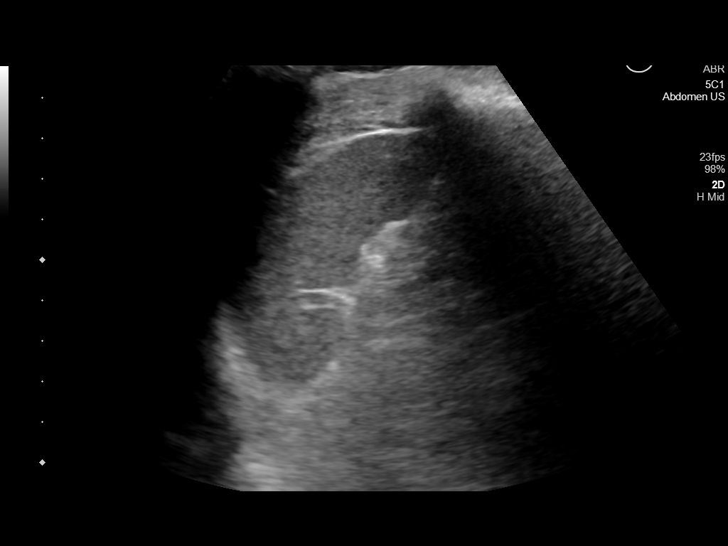
[im 52/84]
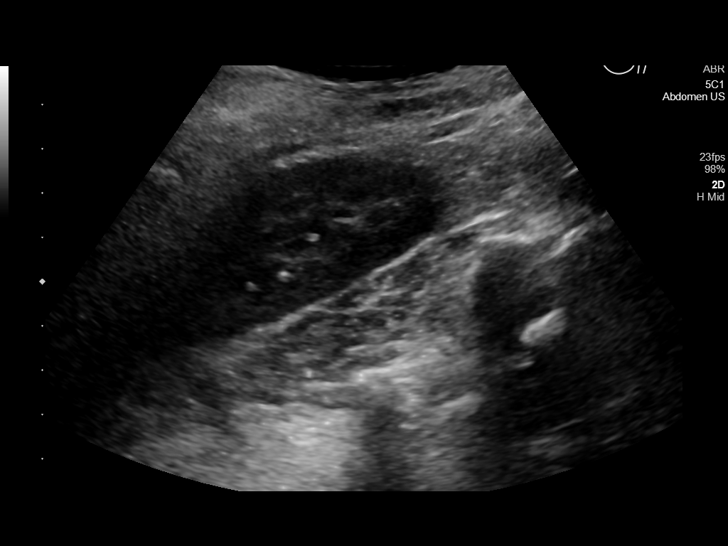
[im 56/84]
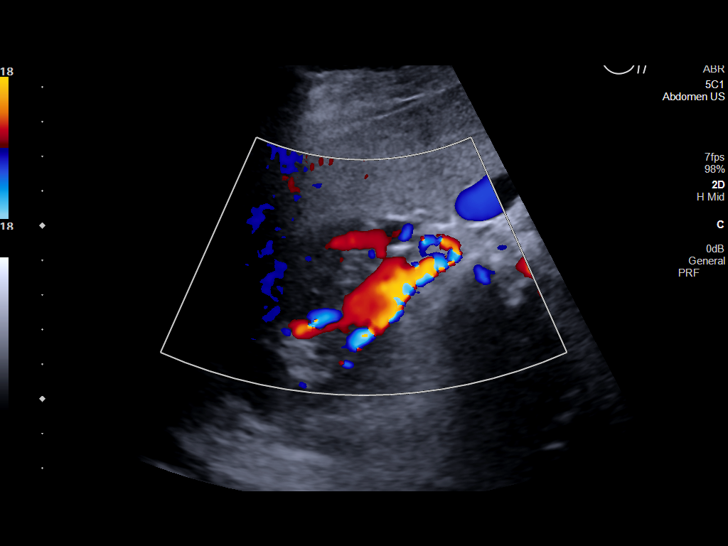
[im 63/84]
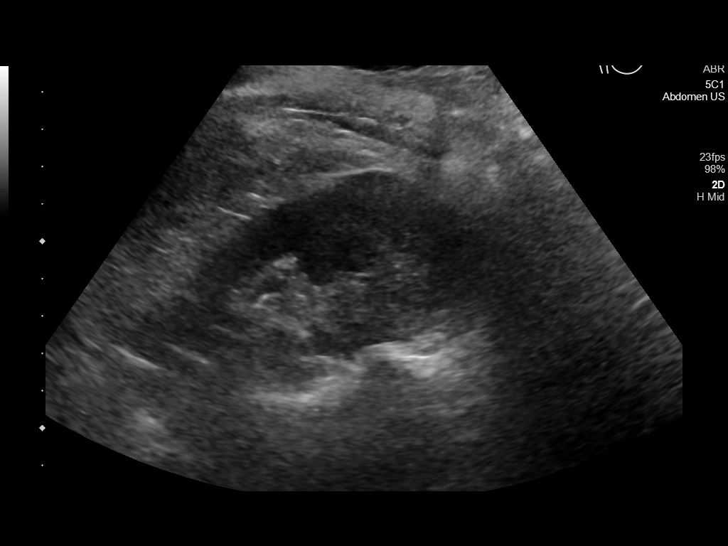
[im 70/84]
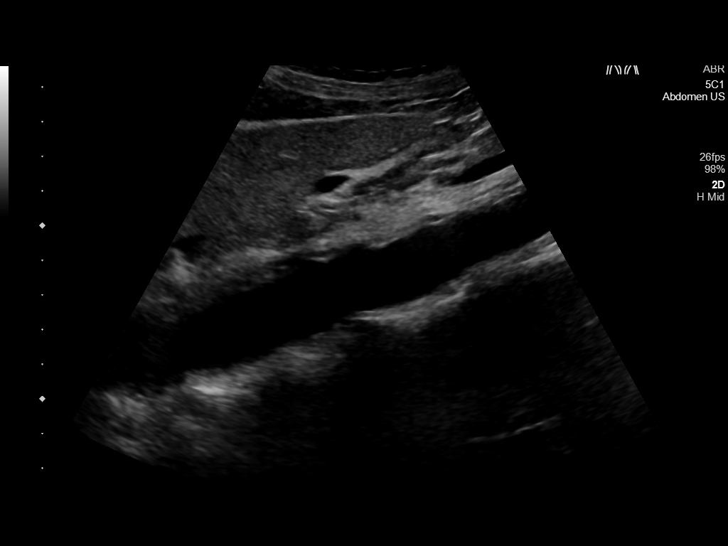
[im 77/84]
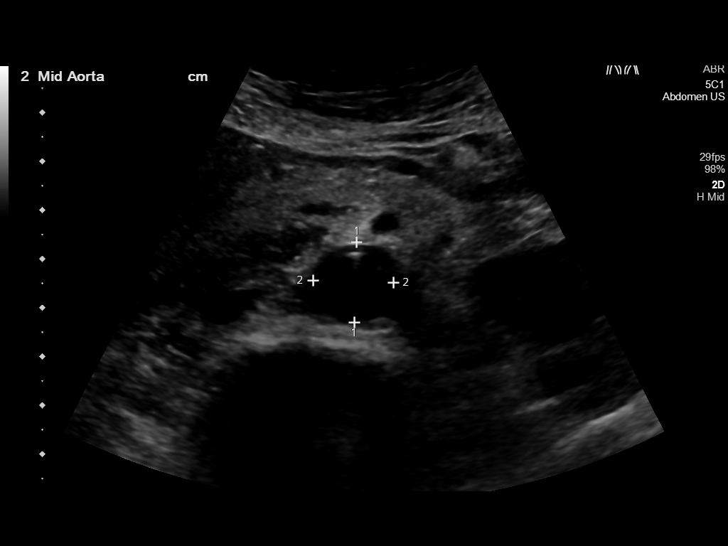
[im 84/84]
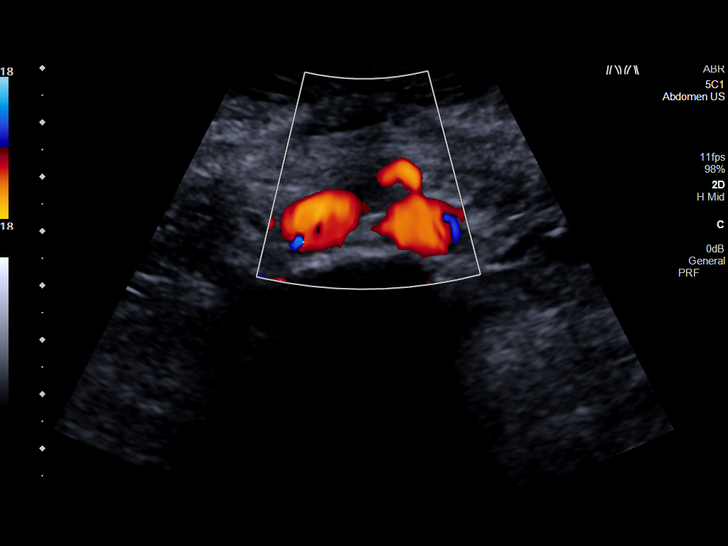

[14 of 25 positions shown; findings below may reference images not displayed]

FINDINGS: Gallbladder: No gallstones or wall thickening visualized. No
sonographic Murphy sign noted by sonographer.

Common bile duct: Diameter: 1-2 mm

Liver: No focal lesion identified. Within normal limits in
parenchymal echogenicity. Portal vein is patent on color Doppler
imaging with normal direction of blood flow towards the liver.

IVC: No abnormality visualized.

Pancreas: Visualized portion unremarkable.

Spleen: Size and appearance within normal limits.

Right Kidney: Length: 10.3 cm. Echogenicity within normal limits. No
mass or hydronephrosis visualized.

Left Kidney: Length: 9.7 cm. Echogenicity within normal limits. No
mass or hydronephrosis visualized.

Abdominal aorta: No aneurysm visualized.

Other findings: None.
IMPRESSION: No acute findings. No findings to explain the patient's history of
pain.

## 2022-09-30 ENCOUNTER — Ambulatory Visit
Admission: RE | Admit: 2022-09-30 | Discharge: 2022-09-30 | Disposition: A | Discharge status: Home / Self Care | Payer: Medicaid Other | Source: Ambulatory Visit | Attending: Nurse Practitioner | Admitting: Nurse Practitioner

## 2022-09-30 ENCOUNTER — Ambulatory Visit: Admission: RE | Admit: 2022-09-30 | Payer: Medicaid Other | Source: Ambulatory Visit

## 2022-09-30 ENCOUNTER — Other Ambulatory Visit: Payer: Self-pay | Admitting: Nurse Practitioner

## 2022-09-30 DIAGNOSIS — N644 Mastodynia: Secondary | ICD-10-CM

## 2022-10-13 ENCOUNTER — Other Ambulatory Visit: Payer: Self-pay | Admitting: Internal Medicine

## 2022-10-13 DIAGNOSIS — N63 Unspecified lump in unspecified breast: Secondary | ICD-10-CM

## 2022-10-18 ENCOUNTER — Other Ambulatory Visit (HOSPITAL_COMMUNITY): Payer: Self-pay | Admitting: Emergency Medicine

## 2022-10-18 ENCOUNTER — Telehealth (HOSPITAL_COMMUNITY): Payer: Self-pay | Admitting: Emergency Medicine

## 2022-10-18 ENCOUNTER — Ambulatory Visit: Payer: Medicaid Other | Admitting: Family Medicine

## 2022-10-18 ENCOUNTER — Encounter (HOSPITAL_COMMUNITY): Payer: Self-pay

## 2022-10-18 MED ORDER — METOPROLOL TARTRATE 25 MG PO TABS: 25.0000 mg | ORAL_TABLET | Freq: Once | ORAL | 0 refills | Status: AC

## 2022-10-18 NOTE — Telephone Encounter (Signed)
Reaching out to patient to offer assistance regarding upcoming cardiac imaging study; pt verbalizes understanding of appt date/time, parking situation and where to check in, pre-test NPO status and medications ordered, and verified current allergies; name and call back number provided for further questions should they arise Rockwell Alexandria RN Navigator Cardiac Imaging [facility] [phone] office [phone] cell  25mg  metoprolol tartrate

## 2022-10-19 ENCOUNTER — Ambulatory Visit
Admission: RE | Admit: 2022-10-19 | Discharge: 2022-10-19 | Disposition: A | Discharge status: Home / Self Care | Payer: Medicaid Other | Source: Ambulatory Visit | Attending: Internal Medicine | Admitting: Internal Medicine

## 2022-10-19 DIAGNOSIS — N63 Unspecified lump in unspecified breast: Secondary | ICD-10-CM | POA: Diagnosis present

## 2022-10-19 MED ORDER — IOHEXOL 350 MG/ML SOLN
80.0000 mL | Freq: Once | INTRAVENOUS | Status: AC | PRN
  Administered 2022-10-19: 80 mL via INTRAVENOUS

## 2022-10-19 MED ORDER — NITROGLYCERIN 0.4 MG SL SUBL
0.8000 mg | SUBLINGUAL_TABLET | Freq: Once | SUBLINGUAL | Status: AC
  Administered 2022-10-19: 0.8 mg via SUBLINGUAL
  Filled 2022-10-19: qty 25

## 2022-10-19 MED ORDER — METOPROLOL TARTRATE 5 MG/5ML IV SOLN
5.0000 mg | Freq: Once | INTRAVENOUS | Status: DC
  Filled 2022-10-19: qty 5

## 2022-10-19 MED ORDER — METOPROLOL TARTRATE 5 MG/5ML IV SOLN
10.0000 mg | Freq: Once | INTRAVENOUS | Status: AC
  Administered 2022-10-19: 10 mg via INTRAVENOUS
  Filled 2022-10-19: qty 10

## 2022-10-19 MED ORDER — METOPROLOL TARTRATE 5 MG/5ML IV SOLN
INTRAVENOUS | Status: AC
  Filled 2022-10-19: qty 10

## 2022-10-19 NOTE — Progress Notes (Signed)
Pt tolerated procedure well with no issues. Pt ABCs intact. Pt denies any complaints. Pt encouraged to drink plenty of water throughout the day. Pt ambulatory with steady gait.

## 2023-02-19 ENCOUNTER — Ambulatory Visit
Admission: EM | Admit: 2023-02-19 | Discharge: 2023-02-19 | Discharge status: Against Medical Advice | Payer: Medicaid Other

## 2023-02-19 NOTE — ED Triage Notes (Signed)
Called pt phone to begin triage. Pt did not answer. left a message to call back

## 2023-03-14 ENCOUNTER — Encounter: Payer: Self-pay | Admitting: *Deleted

## 2023-04-18 ENCOUNTER — Ambulatory Visit: Admission: RE | Admit: 2023-04-18 | Payer: Medicaid Other | Source: Home / Self Care

## 2023-04-18 SURGERY — COLONOSCOPY WITH PROPOFOL
Anesthesia: General

## 2023-06-11 IMAGING — CT CT ABD-PELV W/ CM
2 of 4 series · 16 of 46 positions shown, 18 images · IV contrast (agent unspecified)
Comparison: None Available.

CLINICAL DATA: Epigastric pain

EXAM:
CT ABDOMEN AND PELVIS WITH CONTRAST
TECHNIQUE: Multidetector CT imaging of the abdomen and pelvis was performed
using the standard protocol following bolus administration of
intravenous contrast.

[Series 2: abd pelvis 5.00 · axial · 0.66mm/px · z∈[-1507,-1102]mm · 13 of 89 slices shown, 15 images]
[im 4/89  soft-tissue]
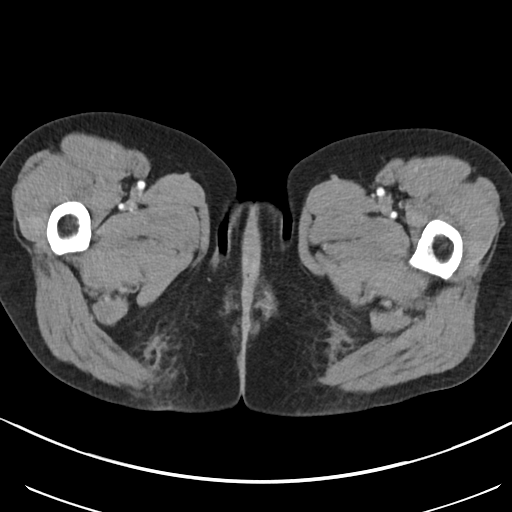
[im 4/89  bone]
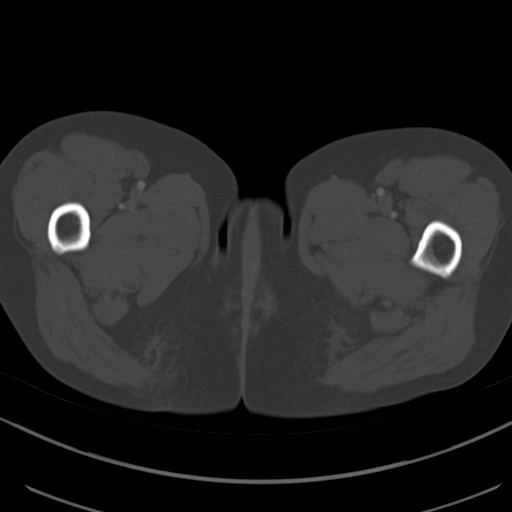
[im 11/89  soft-tissue]
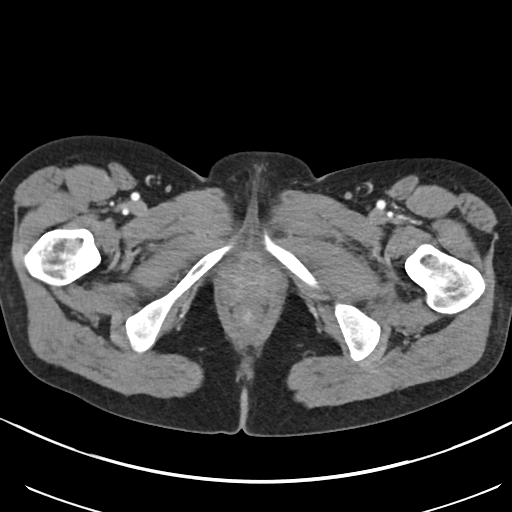
[im 18/89  soft-tissue]
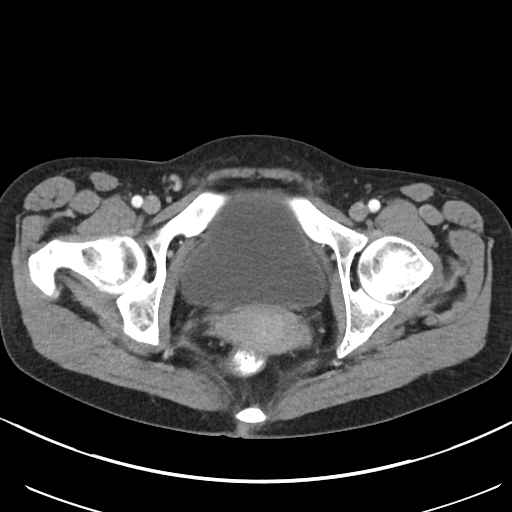
[im 25/89  soft-tissue]
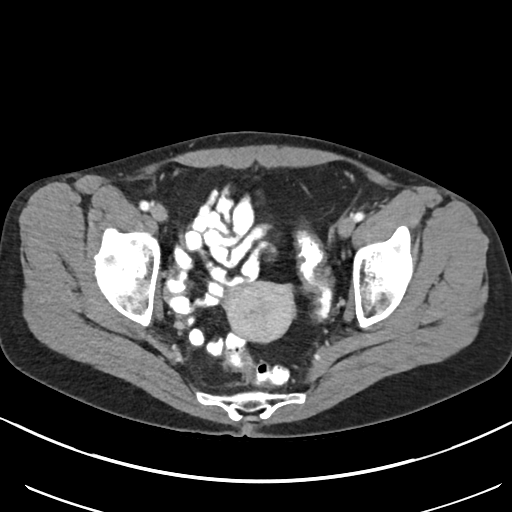
[im 32/89  soft-tissue]
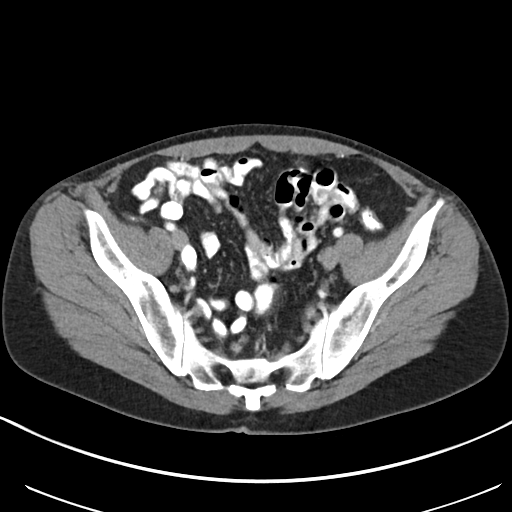
[im 39/89  soft-tissue]
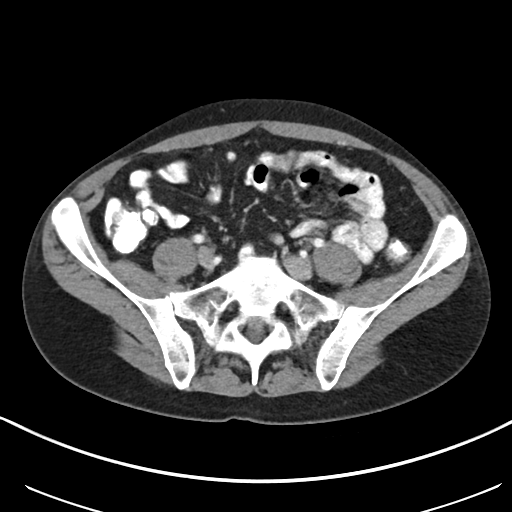
[im 46/89  soft-tissue]
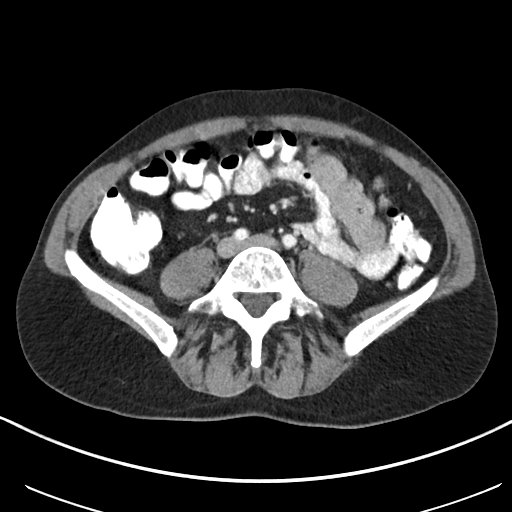
[im 50/89  soft-tissue]
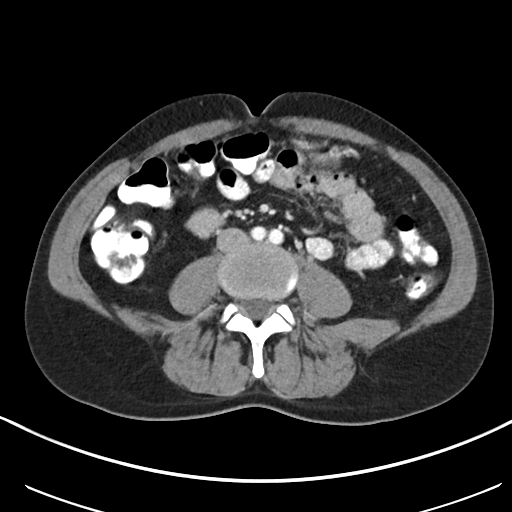
[im 57/89  soft-tissue]
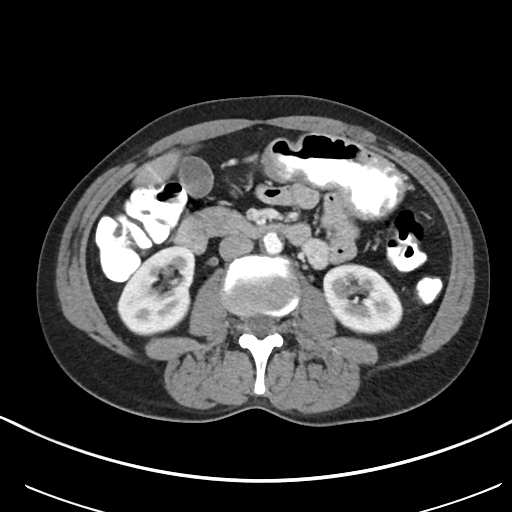
[im 57/89  bone]
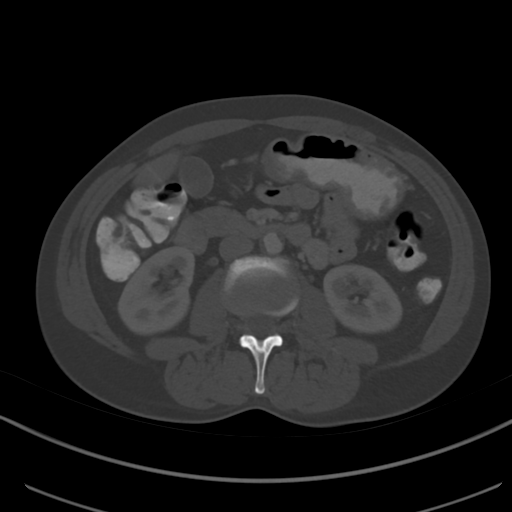
[im 64/89  soft-tissue]
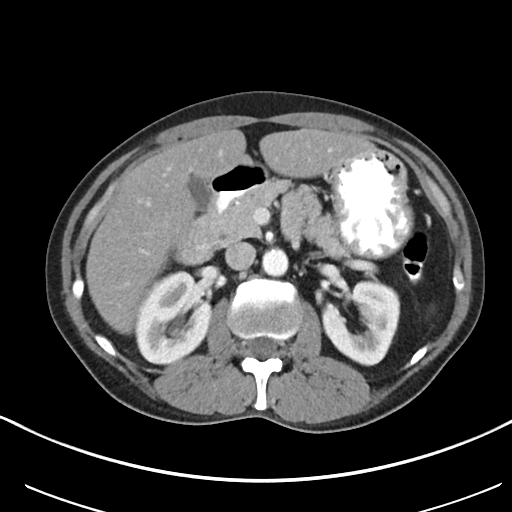
[im 71/89  soft-tissue]
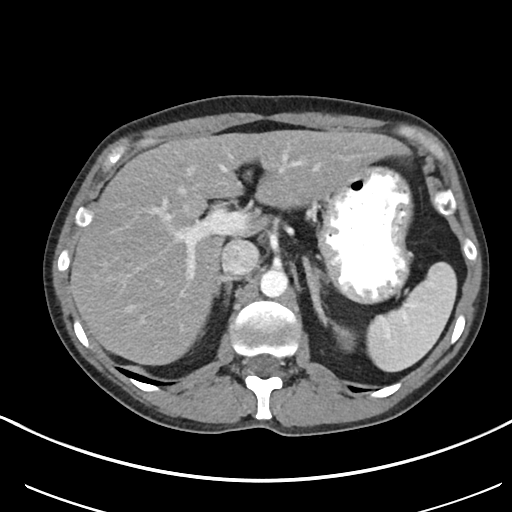
[im 78/89  soft-tissue]
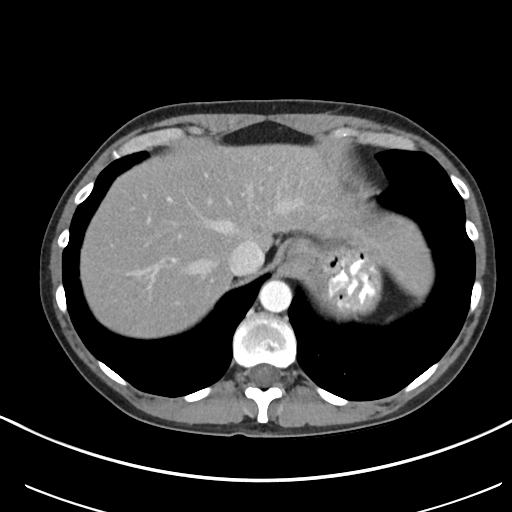
[im 85/89  soft-tissue]
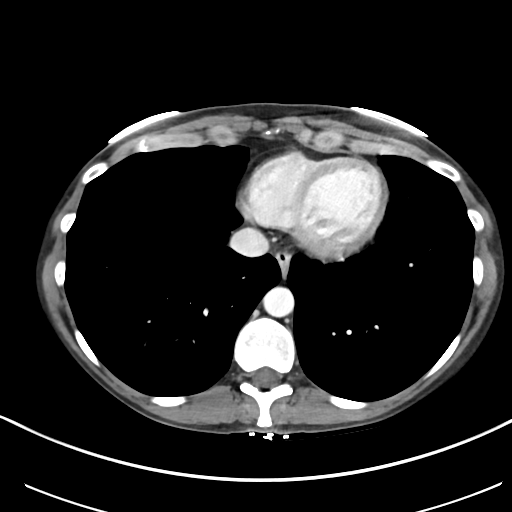

[Series 4: coronals abd pelvis 2.00 cor · coronal · 0.66mm/px · 3 of 121 slices shown]
[im 41/121  soft-tissue]
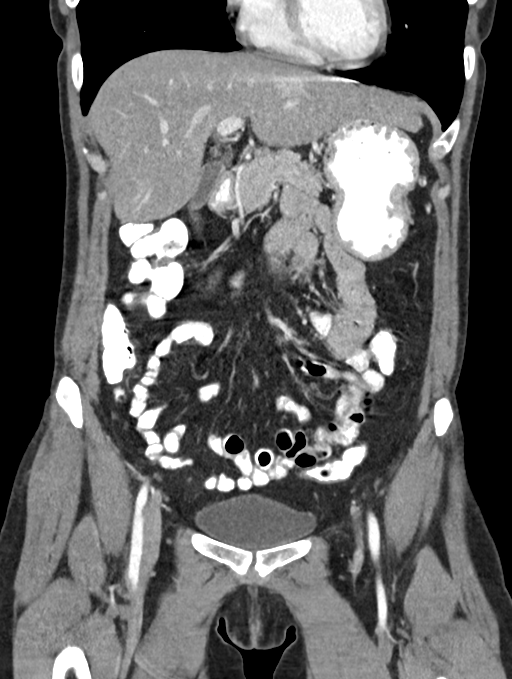
[im 54/121  soft-tissue]
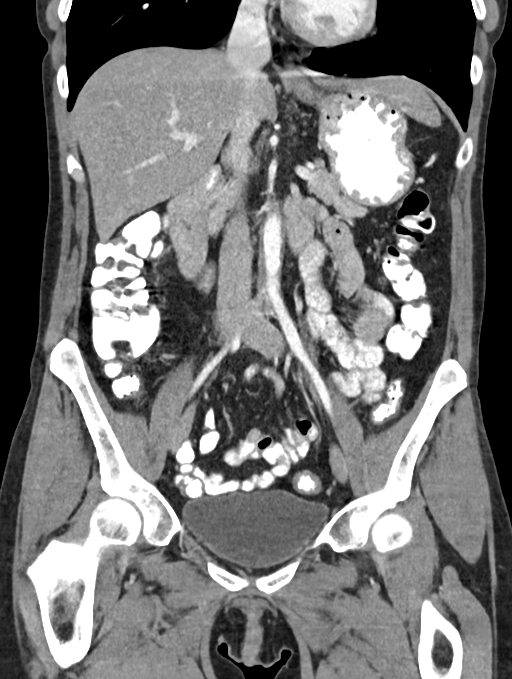
[im 67/121  soft-tissue]
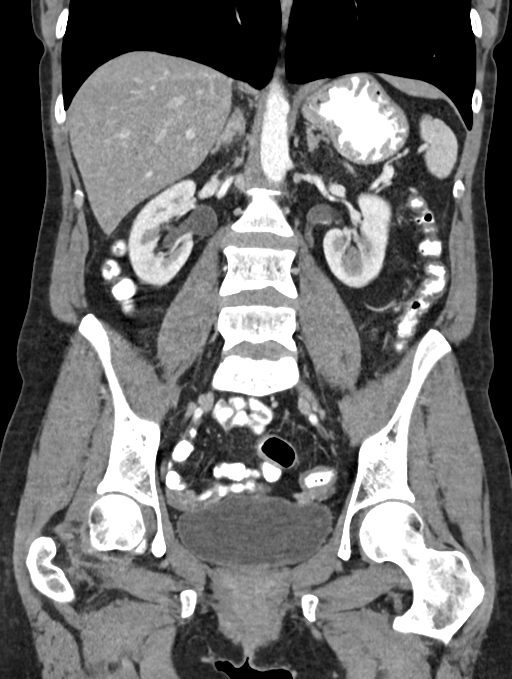

[16 of 46 positions shown; findings below may reference images not displayed]

RADIATION DOSE REDUCTION: This exam was performed according to the
departmental dose-optimization program which includes automated
exposure control, adjustment of the mA and/or kV according to
patient size and/or use of iterative reconstruction technique.

CONTRAST:  80mL OMNIPAQUE IOHEXOL 300 MG/ML  SOLN
FINDINGS: Lower chest: No acute abnormality.

Hepatobiliary: Liver is normal in size and contour with no
suspicious mass identified. Gallbladder appears normal. No biliary
ductal dilatation.

Pancreas: Unremarkable. No pancreatic ductal dilatation or
surrounding inflammatory changes.

Spleen: Normal in size without focal abnormality.

Adrenals/Urinary Tract: Adrenal glands are unremarkable. Kidneys are
normal, without renal calculi, focal lesion, or hydronephrosis.
Bladder is unremarkable.

Stomach/Bowel: Stomach is within normal limits. Appendix appears
normal. No evidence of bowel wall thickening, distention, or
inflammatory changes.

Vascular/Lymphatic: Aortic atherosclerosis. No enlarged abdominal or
pelvic lymph nodes.

Reproductive: Uterus and bilateral adnexa are unremarkable.

Other: No ascites.

Musculoskeletal: No acute or significant osseous findings.
IMPRESSION: No acute process identified in the abdomen or pelvis.
